# Patient Record
Sex: Female | Born: 1980
Health system: Southern US, Community
[De-identification: ages and names within clinical notes are randomized; demographics above are authoritative.]

## PROBLEM LIST (undated history)

## (undated) ENCOUNTER — Inpatient Hospital Stay (HOSPITAL_COMMUNITY): Payer: Self-pay

## (undated) DIAGNOSIS — O139 Gestational [pregnancy-induced] hypertension without significant proteinuria, unspecified trimester: Secondary | ICD-10-CM

## (undated) DIAGNOSIS — F32A Depression, unspecified: Secondary | ICD-10-CM

## (undated) DIAGNOSIS — D649 Anemia, unspecified: Secondary | ICD-10-CM

## (undated) DIAGNOSIS — F329 Major depressive disorder, single episode, unspecified: Secondary | ICD-10-CM

## (undated) HISTORY — PX: DILATION AND CURETTAGE OF UTERUS: SHX78

---

## 2009-12-10 ENCOUNTER — Emergency Department (HOSPITAL_BASED_OUTPATIENT_CLINIC_OR_DEPARTMENT_OTHER): Admission: EM | Admit: 2009-12-10 | Discharge: 2009-12-10 | Payer: Self-pay | Admitting: Emergency Medicine

## 2011-01-21 ENCOUNTER — Emergency Department (HOSPITAL_BASED_OUTPATIENT_CLINIC_OR_DEPARTMENT_OTHER)
Admission: EM | Admit: 2011-01-21 | Discharge: 2011-01-21 | Disposition: A | Discharge status: Home / Self Care | Payer: Self-pay | Attending: Emergency Medicine | Admitting: Emergency Medicine

## 2011-01-21 ENCOUNTER — Other Ambulatory Visit: Payer: Self-pay | Admitting: Emergency Medicine

## 2011-01-21 DIAGNOSIS — IMO0002 Reserved for concepts with insufficient information to code with codable children: Secondary | ICD-10-CM | POA: Insufficient documentation

## 2011-01-24 LAB — PREGNANCY, URINE: Preg Test, Ur: NEGATIVE

## 2011-02-08 ENCOUNTER — Encounter: Payer: Self-pay | Admitting: Obstetrics and Gynecology

## 2011-09-04 ENCOUNTER — Encounter (HOSPITAL_COMMUNITY): Payer: Self-pay

## 2011-09-04 ENCOUNTER — Inpatient Hospital Stay (HOSPITAL_COMMUNITY): Payer: Medicaid Other

## 2011-09-04 ENCOUNTER — Inpatient Hospital Stay (HOSPITAL_COMMUNITY)
Admission: AD | Admit: 2011-09-04 | Discharge: 2011-09-04 | Disposition: A | Discharge status: Home / Self Care | Payer: Medicaid Other | Source: Ambulatory Visit | Attending: Family Medicine | Admitting: Family Medicine

## 2011-09-04 DIAGNOSIS — R109 Unspecified abdominal pain: Secondary | ICD-10-CM | POA: Insufficient documentation

## 2011-09-04 DIAGNOSIS — O209 Hemorrhage in early pregnancy, unspecified: Secondary | ICD-10-CM

## 2011-09-04 HISTORY — DX: Anemia, unspecified: D64.9

## 2011-09-04 HISTORY — DX: Gestational (pregnancy-induced) hypertension without significant proteinuria, unspecified trimester: O13.9

## 2011-09-04 LAB — URINALYSIS, ROUTINE W REFLEX MICROSCOPIC
Ketones, ur: NEGATIVE mg/dL
Leukocytes, UA: NEGATIVE
Nitrite: NEGATIVE
Protein, ur: NEGATIVE mg/dL
Urobilinogen, UA: 0.2 mg/dL (ref 0.0–1.0)

## 2011-09-04 LAB — CBC
HCT: 32.7 % — ABNORMAL LOW (ref 36.0–46.0)
Hemoglobin: 11 g/dL — ABNORMAL LOW (ref 12.0–15.0)
MCV: 87 fL (ref 78.0–100.0)
RDW: 12.3 % (ref 11.5–15.5)
WBC: 5.8 10*3/uL (ref 4.0–10.5)

## 2011-09-04 LAB — WET PREP, GENITAL
Clue Cells Wet Prep HPF POC: NONE SEEN
Trich, Wet Prep: NONE SEEN

## 2011-09-04 NOTE — ED Provider Notes (Signed)
Chart reviewed and agree with management and plan.  

## 2011-09-04 NOTE — Progress Notes (Signed)
Patient state the first day of her last normal period was 8-10. Took Plan B on 8-10. Started bleeding again 8-26. Started cramping about one week ago until last night. No pain at this time. Had spotting this am. Last night did 2 home pregnancy tests, one was positive and one negative.

## 2011-09-04 NOTE — Progress Notes (Signed)
Pt states LMP-06/01/2011, took Plan B 06/09/2011, no menses, then had 4-5 day cycle on 06/17/2011. No menses since then. Had cramping last week that felt like menstrual cycle, no pain presently. Had both pos/neg results on home upts. ?yeast infection, white vaginal d/c, odorous, thick, noted x3-4 months.

## 2011-09-04 NOTE — ED Provider Notes (Signed)
History   Pt presents today c/o lower abd pain and spotting on and off since the end of August. Her last episode of intercourse was several days ago. She denies fever, vag irritation, or any other sx at this time.  Chief Complaint  Patient presents with  . Possible Pregnancy   HPI  OB History    Grav Para Term Preterm Abortions TAB SAB Ect Mult Living   5 1 1  3 3    1       Past Medical History  Diagnosis Date  . Pregnancy induced hypertension   . Anemia     Past Surgical History  Procedure Date  . Cesarean section   . Dilation and curettage of uterus     No family history on file.  History  Substance Use Topics  . Smoking status: Current Everyday Smoker -- 1.0 packs/day    Types: Cigarettes  . Smokeless tobacco: Never Used  . Alcohol Use: No    Allergies: No Known Allergies  Prescriptions prior to admission  Medication Sig Dispense Refill  . Aspirin-Salicylamide-Caffeine (BC HEADACHE POWDER PO) Take 1 packet by mouth daily as needed. For headache         Review of Systems  Constitutional: Negative for fever.  Cardiovascular: Negative for chest pain.  Gastrointestinal: Positive for abdominal pain. Negative for nausea, vomiting, diarrhea and constipation.  Genitourinary: Negative for dysuria, urgency, frequency and hematuria.  Neurological: Negative for dizziness and headaches.  Psychiatric/Behavioral: Negative for depression and suicidal ideas.   Physical Exam   Blood pressure 118/53, pulse 80, temperature 98.6 F (37 C), temperature source Oral, resp. rate 16, height 5' 4.5" (1.638 m), weight 199 lb 3.2 oz (90.357 kg), last menstrual period 06/01/2011, SpO2 99.00%.  Physical Exam  Nursing note and vitals reviewed. Constitutional: She is oriented to person, place, and time. She appears well-developed and well-nourished. No distress.  HENT:  Head: Normocephalic and atraumatic.  Eyes: EOM are normal. Pupils are equal, round, and reactive to light.  GI:  Soft. She exhibits no distension. There is no tenderness. There is no rebound and no guarding.  Genitourinary: No bleeding around the vagina. Vaginal discharge found.       Cervix Lg/closed. No adnexal masses.  Neurological: She is alert and oriented to person, place, and time.  Skin: Skin is warm and dry. She is not diaphoretic.  Psychiatric: She has a normal mood and affect. Her behavior is normal. Judgment and thought content normal.    MAU Course  Procedures  Wet prep and GC/Chlamydia cultures done.  Results for orders placed during the hospital encounter of 09/04/11 (from the past 24 hour(s))  URINALYSIS, ROUTINE W REFLEX MICROSCOPIC     Status: Abnormal   Collection Time   09/04/11 10:51 AM      Component Value Range   Color, Urine YELLOW  YELLOW    Appearance CLOUDY (*) CLEAR    Specific Gravity, Urine 1.025  1.005 - 1.030    pH 8.0  5.0 - 8.0    Glucose, UA NEGATIVE  NEGATIVE (mg/dL)   Hgb urine dipstick NEGATIVE  NEGATIVE    Bilirubin Urine NEGATIVE  NEGATIVE    Ketones, ur NEGATIVE  NEGATIVE (mg/dL)   Protein, ur NEGATIVE  NEGATIVE (mg/dL)   Urobilinogen, UA 0.2  0.0 - 1.0 (mg/dL)   Nitrite NEGATIVE  NEGATIVE    Leukocytes, UA NEGATIVE  NEGATIVE   POCT PREGNANCY, URINE     Status: Normal   Collection Time  09/04/11 10:55 AM      Component Value Range   Preg Test, Ur POSITIVE    WET PREP, GENITAL     Status: Normal   Collection Time   09/04/11 11:24 AM      Component Value Range   Yeast, Wet Prep NONE SEEN  NONE SEEN    Trich, Wet Prep NONE SEEN  NONE SEEN    Clue Cells, Wet Prep NONE SEEN  NONE SEEN    WBC, Wet Prep HPF POC NONE SEEN  NONE SEEN   ABO/RH     Status: Normal   Collection Time   09/04/11 11:35 AM      Component Value Range   ABO/RH(D) O POS    HCG, QUANTITATIVE, PREGNANCY     Status: Abnormal   Collection Time   09/04/11 11:35 AM      Component Value Range   hCG, Beta Chain, Quant, S 42503 (*) <5 (mIU/mL)  CBC     Status: Abnormal    Collection Time   09/04/11 11:35 AM      Component Value Range   WBC 5.8  4.0 - 10.5 (K/uL)   RBC 3.76 (*) 3.87 - 5.11 (MIL/uL)   Hemoglobin 11.0 (*) 12.0 - 15.0 (g/dL)   HCT 16.1 (*) 09.6 - 46.0 (%)   MCV 87.0  78.0 - 100.0 (fL)   MCH 29.3  26.0 - 34.0 (pg)   MCHC 33.6  30.0 - 36.0 (g/dL)   RDW 04.5  40.9 - 81.1 (%)   Platelets 231  150 - 400 (K/uL)   US shows single IUP at 6.5wks with an EDC of 04/24/12.  Assessment and Plan  Bleeding in preg: discussed with pt at length. She will f/u with her OB provider. Discussed diet, activity, risks, and precautions.  Clinton Gallant. Tashay Bozich III, DrHSc, MPAS, PA-C  09/04/2011, 11:27 AM   Henrietta Hoover, PA 09/04/11 1244

## 2011-09-05 LAB — GC/CHLAMYDIA PROBE AMP, GENITAL: Chlamydia, DNA Probe: NEGATIVE

## 2011-09-27 ENCOUNTER — Emergency Department (HOSPITAL_BASED_OUTPATIENT_CLINIC_OR_DEPARTMENT_OTHER)
Admission: EM | Admit: 2011-09-27 | Discharge: 2011-09-27 | Disposition: A | Discharge status: Home / Self Care | Payer: Medicaid Other | Attending: Emergency Medicine | Admitting: Emergency Medicine

## 2011-09-27 ENCOUNTER — Encounter (HOSPITAL_BASED_OUTPATIENT_CLINIC_OR_DEPARTMENT_OTHER): Payer: Self-pay | Admitting: Emergency Medicine

## 2011-09-27 DIAGNOSIS — N760 Acute vaginitis: Secondary | ICD-10-CM | POA: Insufficient documentation

## 2011-09-27 DIAGNOSIS — A499 Bacterial infection, unspecified: Secondary | ICD-10-CM | POA: Insufficient documentation

## 2011-09-27 DIAGNOSIS — B9689 Other specified bacterial agents as the cause of diseases classified elsewhere: Secondary | ICD-10-CM | POA: Insufficient documentation

## 2011-09-27 DIAGNOSIS — F17200 Nicotine dependence, unspecified, uncomplicated: Secondary | ICD-10-CM | POA: Insufficient documentation

## 2011-09-27 LAB — WET PREP, GENITAL
Trich, Wet Prep: NONE SEEN
Yeast Wet Prep HPF POC: NONE SEEN

## 2011-09-27 LAB — URINALYSIS, ROUTINE W REFLEX MICROSCOPIC
Glucose, UA: NEGATIVE mg/dL
Hgb urine dipstick: NEGATIVE
Specific Gravity, Urine: 1.025 (ref 1.005–1.030)
pH: 7.5 (ref 5.0–8.0)

## 2011-09-27 LAB — URINE MICROSCOPIC-ADD ON

## 2011-09-27 MED ORDER — METRONIDAZOLE 500 MG PO TABS: 500.0000 mg | ORAL_TABLET | Freq: Two times a day (BID) | ORAL | Status: AC

## 2011-09-27 MED ORDER — METRONIDAZOLE 500 MG PO TABS
500.0000 mg | ORAL_TABLET | Freq: Once | ORAL | Status: AC
  Administered 2011-09-27: 500 mg via ORAL
  Filled 2011-09-27: qty 1

## 2011-09-27 NOTE — ED Notes (Signed)
Patient states she is [redacted] weeks pregnant.  States she is having vaginal bloody spotting for the last 2 weeks.  States she is also having vaginal discharge, has been checked and is told she does not have a vaginal infection.  States she is having pelvic pressure as well.  Denies any pain.

## 2011-09-27 NOTE — ED Notes (Signed)
Gave pt medication and discharge instructions per request from primary nurse.

## 2011-09-27 NOTE — ED Provider Notes (Signed)
History     CSN: [id number] Arrival date & time: 09/27/2011  8:16 AM   First MD Initiated Contact with Patient 09/27/11 [phone]      Chief Complaint  Patient presents with  . Vaginal Bleeding    (Consider location/radiation/quality/duration/timing/severity/associated sxs/prior treatment) HPI The patient is a 30 year old G5 P1 031 at [redacted] weeks gestation with last menstrual period on August 10 of 2012 who presents today complaining of vaginal spotting. Patient has never had a miscarriage previously. She describes having some "pelvic pressure". She is concerned that she could be having a miscarriage. She was diagnosed with pregnancy at [facility]. At that time the patient had an ultrasound with estimated gestational age of [redacted] weeks and 5 days. She was told that everything was fine at that time. She has not been able to followup with an OB/GYN as of yet as her Medicaid is still pending. The patient denies any nausea or vomiting. She denies any fevers. She says she feels like she constantly has gas and some mild constipation. She denies any diarrhea. Patient always feels like she has vaginal discharge that has been checked and told that she does not have a bacterial infection. She denies any urinary symptoms. There are no other associated or modifying factors. Past Medical History  Diagnosis Date  . Pregnancy induced hypertension   . Anemia     Past Surgical History  Procedure Date  . Cesarean section   . Dilation and curettage of uterus     History reviewed. No pertinent family history.  History  Substance Use Topics  . Smoking status: Current Everyday Smoker -- 1.0 packs/day    Types: Cigarettes  . Smokeless tobacco: Never Used  . Alcohol Use: No    OB History    Grav Para Term Preterm Abortions TAB SAB Ect Mult Living   5 1 1  3 3    1       Review of Systems  Constitutional: Negative.   HENT: Negative.   Eyes: Negative.   Cardiovascular: Negative.   Gastrointestinal:  Positive for abdominal pain.  Genitourinary: Positive for vaginal bleeding.  Musculoskeletal: Negative.   Neurological: Negative.   Hematological: Negative.   Psychiatric/Behavioral: Negative.   All other systems reviewed and are negative.    Allergies  Review of patient's allergies indicates no known allergies.  Home Medications   Current Outpatient Rx  Name Route Sig Dispense Refill  . BC HEADACHE POWDER PO Oral Take 1 packet by mouth daily as needed. For headache       BP 134/80  Pulse 73  Temp(Src) 97.9 F (36.6 C) (Oral)  Resp 18  Ht 5\' 4"  (1.626 m)  Wt 200 lb (90.719 kg)  BMI 34.33 kg/m2  SpO2 100%  LMP 06/01/2011  Physical Exam  Nursing note and vitals reviewed. Constitutional: She is oriented to person, place, and time. She appears well-developed and well-nourished. No distress.  HENT:  Head: Normocephalic and atraumatic.  Eyes: Conjunctivae and EOM are normal. Pupils are equal, round, and reactive to light.  Neck: Normal range of motion.  Cardiovascular: Normal rate, regular rhythm, normal heart sounds and intact distal pulses.  Exam reveals no gallop and no friction rub.   No murmur heard. Pulmonary/Chest: Effort normal and breath sounds normal. No respiratory distress. She has no wheezes. She has no rales.  Abdominal: Soft. Bowel sounds are normal. She exhibits no distension. There is no tenderness. There is no rebound and no guarding.  Genitourinary: Cervix exhibits discharge. Cervix  exhibits no motion tenderness and no friability. Right adnexum displays no mass and no tenderness. Left adnexum displays no mass and no tenderness. Vaginal discharge found.       Patient with slight yellowish discharge. There is a very small amount of blood appreciated in this. Cervix is closed.  Musculoskeletal: Normal range of motion. She exhibits no edema.  Neurological: She is alert and oriented to person, place, and time. No cranial nerve deficit. She exhibits normal muscle  tone. Coordination normal.  Skin: Skin is warm and dry. No rash noted.  Psychiatric: She has a normal mood and affect.    ED Course  Procedures (including critical care time)  Labs Reviewed  URINALYSIS, ROUTINE W REFLEX MICROSCOPIC - Abnormal; Notable for the following:    APPearance TURBID (*)    Ketones, ur 15 (*)    All other components within normal limits  WET PREP, GENITAL - Abnormal; Notable for the following:    Clue Cells, Wet Prep FEW (*)    WBC, Wet Prep HPF POC FEW (*)    All other components within normal limits  URINE MICROSCOPIC-ADD ON - Abnormal; Notable for the following:    Bacteria, UA FEW (*)    All other components within normal limits  PREGNANCY, URINE  GC/CHLAMYDIA PROBE AMP, GENITAL   No results found.   No diagnosis found.    MDM  Patient was evaluated and was very benign in appearance. Based on her concerns pelvic exam was performed. Urinalysis, urine pregnancy, and pelvic exam samples were obtained.  Patient's urine was bland. Pelvic exam did yield a wet prep which showed clue cells. Given the patient did complain of vaginal discharge as well as pelvic pressure Flagyl was prescribed. She was also given her first dose here. She will take this twice a day for 7 days. Patient was also told not to take aspirin during pregnancy. She was advised to take Tylenol instead for normal complaints. Patient was discharged in good condition.        Cyndra Numbers, MD 09/27/11 1027

## 2011-09-28 LAB — GC/CHLAMYDIA PROBE AMP, GENITAL: GC Probe Amp, Genital: NEGATIVE

## 2011-11-14 ENCOUNTER — Other Ambulatory Visit: Payer: Self-pay | Admitting: Obstetrics & Gynecology

## 2011-11-15 ENCOUNTER — Other Ambulatory Visit: Payer: Self-pay | Admitting: Obstetrics and Gynecology

## 2011-11-22 ENCOUNTER — Other Ambulatory Visit: Payer: Self-pay | Admitting: Obstetrics and Gynecology

## 2011-11-22 ENCOUNTER — Encounter: Payer: Self-pay | Admitting: Obstetrics and Gynecology

## 2011-11-22 LAB — US OB COMP LESS 14 WKS

## 2011-12-18 ENCOUNTER — Other Ambulatory Visit: Payer: Self-pay | Admitting: Obstetrics and Gynecology

## 2011-12-18 DIAGNOSIS — R772 Abnormality of alphafetoprotein: Secondary | ICD-10-CM

## 2011-12-18 DIAGNOSIS — Z3689 Encounter for other specified antenatal screening: Secondary | ICD-10-CM

## 2011-12-21 ENCOUNTER — Ambulatory Visit (HOSPITAL_COMMUNITY)
Admission: RE | Admit: 2011-12-21 | Discharge: 2011-12-21 | Disposition: A | Discharge status: Home / Self Care | Payer: Medicaid Other | Source: Ambulatory Visit | Attending: Obstetrics and Gynecology | Admitting: Obstetrics and Gynecology

## 2011-12-21 ENCOUNTER — Encounter (HOSPITAL_COMMUNITY): Payer: Self-pay

## 2011-12-21 ENCOUNTER — Other Ambulatory Visit: Payer: Self-pay

## 2011-12-21 DIAGNOSIS — Z363 Encounter for antenatal screening for malformations: Secondary | ICD-10-CM | POA: Insufficient documentation

## 2011-12-21 DIAGNOSIS — O34219 Maternal care for unspecified type scar from previous cesarean delivery: Secondary | ICD-10-CM | POA: Insufficient documentation

## 2011-12-21 DIAGNOSIS — Z3689 Encounter for other specified antenatal screening: Secondary | ICD-10-CM

## 2011-12-21 DIAGNOSIS — Z1389 Encounter for screening for other disorder: Secondary | ICD-10-CM | POA: Insufficient documentation

## 2011-12-21 DIAGNOSIS — O358XX0 Maternal care for other (suspected) fetal abnormality and damage, not applicable or unspecified: Secondary | ICD-10-CM | POA: Insufficient documentation

## 2011-12-21 DIAGNOSIS — R772 Abnormality of alphafetoprotein: Secondary | ICD-10-CM

## 2011-12-24 NOTE — Progress Notes (Signed)
Genetic Counseling  High-Risk Gestation Note  Appointment Date:  12/21/2011 Referred By: Fortino Sic, MD Date of Birth:  06/17/1981    Pregnancy History: [id number] Estimated Date of Delivery: 04/25/12 Estimated Gestational Age: [redacted]w[redacted]d Attending: Particia Nearing, MD  [name] was seen for genetic counseling because of an increased risk for fetal Down syndrome based on Quad screen performed through [employer].  She was counseled regarding the Quad screen result and the associated 1 in 169 risk for fetal Down syndrome. (However, we discussed that the Quad screen was first performed using the correct estimated date of delivery, which was also used for this third and final calculation, but a Down syndrome risk of 1 in 95 was given for the first result. After discussed with [facility] laboratory, it is unclear why these two risk assessments differ, given that both were calculated with the same estimated date of delivery.)  We reviewed chromosomes, nondisjunction, and the common features and variable prognosis of Down syndrome.  In addition, we reviewed the screen adjusted reduction in risks for trisomy 18 (1 in 4750) and ONTDs (less than 1 in 54,600).  We also discussed other explanations for a screen positive result including: a gestational dating error, differences in maternal metabolism, and normal variation.  We reviewed other available screening and diagnostic options including detailed ultrasound and amniocentesis.  We discussed the risks, limitations, and benefits of each.  We discussed another type of screening test, noninvasive prenatal testing (NIPT), which utilizes cell free fetal DNA found in the maternal circulation. This test is not diagnostic for chromosome conditions, but can provide information regarding the presence or absence of extra fetal DNA for chromosomes 13, 18 and 21. Thus, it would not identify or rule out all fetal aneuploidy. The reported detection rate is greater  than 99% for Trisomy 21, greater than 97% for Trisomy 18, and is approximately 80% (8 out of 10) for Trisomy 13. The false positive rate is thought to be less than 1% for any of these conditions. After thoughtful consideration of these options, [name] elected to have ultrasound and cell free fetal DNA ([facility]), but declined amniocentesis.  The cell free fetal DNA testing results will be back in approximately 8-10 days. We will contact the patient and forward results to her OB office once received. She understands that ultrasound and screening cannot rule out all birth defects or genetic syndromes.  The patient was advised of this limitation and states she still does not want diagnostic testing at this time.   However, she was counseled that 50-80% of fetuses with Down syndrome, when well visualized, have detectable anomalies or soft markers by ultrasound.  A complete ultrasound was performed today.  The ultrasound report will be sent under separate cover.  [name] was provided with written information regarding sickle cell anemia (SCA) including the carrier frequency and incidence in the African-American population, the availability of carrier testing and prenatal diagnosis if indicated.  In addition, we discussed that hemoglobinopathies are routinely screened for as part of the  newborn screening panel.  We discussed that her OB medical records indicated that screening was previously performed, and [name] was reported to have a Hemoglobin AA and thus not have sickle cell trait.    Both family histories were reviewed and found to be contributory for the patient's maternal half-nephew (her maternal half-brother's son) who died at age 26 year due to "heart and lung issues" from birth. Very limited information was known regarding  his specific features and an underlying cause. We discussed that heart and lung problems at birth can be sporadic, genetic, environmental, or multifactorial.  Without further information regarding the provided family history, an accurate genetic risk cannot be calculated. Targeted ultrasound is available to assess fetal growth and development in detail. However, without additional information regarding the specific health concerns in this relative, we are unable to assess whether or not these features may be visualized prenatally. Further genetic counseling is warranted if more information is obtained.  Ms. Jerrye Noble denied exposure to environmental toxins or chemical agents. She denied the use of tobacco or street drugs. She reported drinking alcohol prior to being aware of the pregnancy. The all-or-none period was discussed, meaning exposures that occur in the first 4 weeks of gestation are typically thought to either not affect the pregnancy at all or result in a miscarriage.  Prenatal alcohol exposure after the first 4 weeks of gestation can increase the risk for growth delays, small head size, heart defects, eye and facial differences, as well as behavior problems and learning disabilities. The risk of these to occur tends to increase with the amount of alcohol consumed. However, because there is no identified safe amount of alcohol in pregnancy, it is recommended to completely avoid alcohol in pregnancy. Given the reported timing of exposure, risk for associated effects are likely low in the current pregnancy. She denied significant viral illnesses during the course of her pregnancy. Her medical and surgical histories were noncontributory.   I counseled [name] for approximately 35 minutes regarding the above risks and available options.     Quinn Plowman, MS,  Certified Genetic Counselor  12/24/2011

## 2012-01-03 ENCOUNTER — Other Ambulatory Visit (HOSPITAL_COMMUNITY): Payer: Self-pay | Admitting: Obstetrics and Gynecology

## 2012-01-03 ENCOUNTER — Telehealth (HOSPITAL_COMMUNITY): Payer: Self-pay | Admitting: MS"

## 2012-01-03 NOTE — Telephone Encounter (Signed)
Called Julia [name] to discuss her [facility], cell free fetal DNA testing.  We reviewed that these are within normal limits, showing a less than 1 in 10,000 risk for trisomies 21, 18 and 13.  We reviewed that this testing identifies > 99% of pregnancies with trisomy 21, >97% of pregnancies with trisomy 43, and >80% with trisomy 55; the false positive rate is <0.1% for all conditions.  She understands that this testing does not identify all genetic conditions.  All questions were answered to her satisfaction, she was encouraged to call with additional questions or concerns.  Quinn Plowman, MS Patent attorney

## 2012-01-03 NOTE — H&P (Signed)
Julia [name] is an 31 y.o. female.  Z6X0 A4 for a repeat C/S.  Prenatal course has been uncomplicated. The patient did present late for prenatal care.  Pertinent Gynecological History: Menses: na Bleeding: no Contraception: none DES exposure: denies Blood transfusions: none Sexually transmitted diseases: no past history Previous GYN Procedures: Previously seen severe infection for failure to dilate  Last mammogram: na Date: na Last pap: unknown Date: June 2012   Menstrual History: Menarche age:36 Patient's last menstrual period was 06/01/2011.    Past Medical History  Diagnosis Date  . Pregnancy induced hypertension   . Anemia     Past Surgical History  Procedure Date  . Cesarean section   . Dilation and curettage of uterus     No family history on file.  Social History:  reports that she has been smoking Cigarettes.  She has been smoking about 1 pack per day. She has never used smokeless tobacco. She reports that she does not drink alcohol or use illicit drugs.  Allergies: No Known Allergies   (Not in a hospital admission)  Review of Systems  Constitutional: Negative.   HENT: Negative.   Eyes: Negative.   Respiratory: Negative.   Gastrointestinal: Negative.   Genitourinary: Negative.   Musculoskeletal: Negative.   Skin: Positive for rash.  Neurological: Negative.   Endo/Heme/Allergies: Negative.   Psychiatric/Behavioral: Negative.   All other systems reviewed and are negative.    Last menstrual period 06/01/2011. Physical Exam  Constitutional: She is oriented to person, place, and time. She appears well-developed and well-nourished.  HENT:  Head: Normocephalic and atraumatic.  Mouth/Throat: No oropharyngeal exudate.  Eyes: Right eye exhibits no discharge. Left eye exhibits no discharge. No scleral icterus.  Neck: Normal range of motion. No JVD present. No tracheal deviation present. No thyromegaly present.  Cardiovascular: Normal rate, regular rhythm,  normal heart sounds and intact distal pulses.  Exam reveals no gallop and no friction rub.   No murmur heard. Respiratory: Effort normal. No stridor. No respiratory distress. She has no wheezes. She has no rales. She exhibits no tenderness.  GI: Soft. Bowel sounds are normal. She exhibits no distension and no mass. There is no tenderness. There is no rebound and no guarding.  Genitourinary: Vagina normal. No vaginal discharge found.  Musculoskeletal: Normal range of motion. She exhibits no edema and no tenderness.  Lymphadenopathy:    She has no cervical adenopathy.  Neurological: She is alert and oriented to person, place, and time. She has normal reflexes. She displays normal reflexes. She exhibits normal muscle tone. Coordination normal.  Skin: Skin is warm. No rash noted. No erythema. No pallor.  Psychiatric: She has a normal mood and affect. Her behavior is normal. Judgment and thought content normal.    No results found for this or any previous visit (from the past 24 hour(s)).  No results found.  Assessment/Plan:  39 week pregnancy prior cesarean section for repeat C-section    Daira Hine E 01/03/2012, 2:25 PM

## 2012-02-08 LAB — OB RESULTS CONSOLE HEPATITIS B SURFACE ANTIGEN: Hepatitis B Surface Ag: NEGATIVE

## 2012-02-08 LAB — OB RESULTS CONSOLE ABO/RH: RH Type: POSITIVE

## 2012-02-08 LAB — OB RESULTS CONSOLE RUBELLA ANTIBODY, IGM: Rubella: IMMUNE

## 2012-02-08 LAB — OB RESULTS CONSOLE ANTIBODY SCREEN: Antibody Screen: NEGATIVE

## 2012-04-03 ENCOUNTER — Encounter (HOSPITAL_COMMUNITY): Payer: Self-pay | Admitting: Pharmacist

## 2012-04-07 ENCOUNTER — Encounter (HOSPITAL_COMMUNITY): Payer: Self-pay

## 2012-04-08 ENCOUNTER — Inpatient Hospital Stay (HOSPITAL_COMMUNITY): Admission: RE | Admit: 2012-04-08 | Payer: Medicaid Other | Source: Ambulatory Visit

## 2012-04-11 ENCOUNTER — Encounter (HOSPITAL_COMMUNITY): Payer: Self-pay

## 2012-04-11 ENCOUNTER — Encounter (HOSPITAL_COMMUNITY)
Admission: RE | Admit: 2012-04-11 | Discharge: 2012-04-11 | Disposition: A | Discharge status: Home / Self Care | Payer: Medicaid Other | Source: Ambulatory Visit | Attending: Obstetrics and Gynecology | Admitting: Obstetrics and Gynecology

## 2012-04-11 HISTORY — DX: Depression, unspecified: F32.A

## 2012-04-11 HISTORY — DX: Major depressive disorder, single episode, unspecified: F32.9

## 2012-04-11 LAB — CBC
HCT: 29.5 % — ABNORMAL LOW (ref 36.0–46.0)
MCH: 28.9 pg (ref 26.0–34.0)
MCHC: 33.6 g/dL (ref 30.0–36.0)
MCV: 86 fL (ref 78.0–100.0)
RDW: 14.1 % (ref 11.5–15.5)
WBC: 8.7 10*3/uL (ref 4.0–10.5)

## 2012-04-11 LAB — SURGICAL PCR SCREEN: Staphylococcus aureus: NEGATIVE

## 2012-04-11 NOTE — Patient Instructions (Addendum)
YOUR PROCEDURE IS SCHEDULED ON:04/17/12  ENTER THROUGH THE MAIN ENTRANCE OF [facility] AT: 6am  USE DESK PHONE AND DIAL [phone] TO INFORM us OF YOUR ARRIVAL  CALL [phone] IF YOU HAVE ANY QUESTIONS OR PROBLEMS PRIOR TO YOUR ARRIVAL.  REMEMBER: DO NOT EAT OR DRINK AFTER MIDNIGHT :Wed.    YOU MAY BRUSH YOUR TEETH THE MORNING OF SURGERY   TAKE THESE MEDICINES THE DAY OF SURGERY WITH SIP OF WATER:none   DO NOT WEAR JEWELRY, EYE MAKEUP, LIPSTICK OR DARK FINGERNAIL POLISH DO NOT WEAR LOTIONS  DO NOT SHAVE FOR 48 HOURS PRIOR TO SURGERY  YOU WILL NOT BE ALLOWED TO DRIVE YOURSELF HOME

## 2012-04-14 ENCOUNTER — Encounter (HOSPITAL_COMMUNITY): Payer: Self-pay

## 2012-04-21 ENCOUNTER — Other Ambulatory Visit: Payer: Self-pay | Admitting: Obstetrics & Gynecology
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
## 2012-04-22 MED ORDER — CEFAZOLIN SODIUM-DEXTROSE 2-3 GM-% IV SOLR
2.0000 g | INTRAVENOUS | Status: AC
  Administered 2012-04-23: 2 g via INTRAVENOUS

## 2012-04-23 ENCOUNTER — Encounter (HOSPITAL_COMMUNITY): Payer: Self-pay

## 2012-04-23 ENCOUNTER — Inpatient Hospital Stay (HOSPITAL_COMMUNITY): Payer: Medicaid Other

## 2012-04-23 ENCOUNTER — Encounter (HOSPITAL_COMMUNITY): Payer: Self-pay | Admitting: Anesthesiology

## 2012-04-23 ENCOUNTER — Encounter (HOSPITAL_COMMUNITY): Payer: Self-pay | Admitting: *Deleted

## 2012-04-23 ENCOUNTER — Encounter (HOSPITAL_COMMUNITY)
Admission: RE | Disposition: A | Discharge status: Home / Self Care | Payer: Self-pay | Source: Ambulatory Visit | Attending: Obstetrics & Gynecology

## 2012-04-23 ENCOUNTER — Inpatient Hospital Stay (HOSPITAL_COMMUNITY)
Admission: RE | Admit: 2012-04-23 | Discharge: 2012-04-26 | DRG: 766 | Disposition: A | Discharge status: Home / Self Care | Payer: Medicaid Other | Source: Ambulatory Visit | Attending: Obstetrics & Gynecology | Admitting: Obstetrics & Gynecology

## 2012-04-23 DIAGNOSIS — Z98891 History of uterine scar from previous surgery: Secondary | ICD-10-CM

## 2012-04-23 DIAGNOSIS — N838 Other noninflammatory disorders of ovary, fallopian tube and broad ligament: Secondary | ICD-10-CM | POA: Diagnosis present

## 2012-04-23 DIAGNOSIS — O34599 Maternal care for other abnormalities of gravid uterus, unspecified trimester: Secondary | ICD-10-CM | POA: Diagnosis present

## 2012-04-23 DIAGNOSIS — O34219 Maternal care for unspecified type scar from previous cesarean delivery: Principal | ICD-10-CM | POA: Diagnosis present

## 2012-04-23 LAB — TYPE AND SCREEN: Antibody Screen: NEGATIVE

## 2012-04-23 SURGERY — Surgical Case
Anesthesia: Spinal | Site: Abdomen | Wound class: Clean Contaminated

## 2012-04-23 MED ORDER — MORPHINE SULFATE 0.5 MG/ML IJ SOLN
INTRAMUSCULAR | Status: AC
  Filled 2012-04-23: qty 10

## 2012-04-23 MED ORDER — OXYTOCIN 10 UNIT/ML IJ SOLN
INTRAMUSCULAR | Status: AC
  Filled 2012-04-23: qty 4

## 2012-04-23 MED ORDER — PHENYLEPHRINE 40 MCG/ML (10ML) SYRINGE FOR IV PUSH (FOR BLOOD PRESSURE SUPPORT)
PREFILLED_SYRINGE | INTRAVENOUS | Status: AC
  Filled 2012-04-23: qty 5

## 2012-04-23 MED ORDER — MENTHOL 3 MG MT LOZG: 1.0000 | LOZENGE | OROMUCOSAL | Status: DC | PRN

## 2012-04-23 MED ORDER — ONDANSETRON HCL 4 MG/2ML IJ SOLN: 4.0000 mg | Freq: Three times a day (TID) | INTRAMUSCULAR | Status: DC | PRN

## 2012-04-23 MED ORDER — PHENYLEPHRINE HCL 10 MG/ML IJ SOLN
INTRAMUSCULAR | Status: DC | PRN
  Administered 2012-04-23 (×2): 40 ug via INTRAVENOUS
  Administered 2012-04-23 (×6): 80 ug via INTRAVENOUS

## 2012-04-23 MED ORDER — DIPHENHYDRAMINE HCL 25 MG PO CAPS: 25.0000 mg | ORAL_CAPSULE | Freq: Four times a day (QID) | ORAL | Status: DC | PRN

## 2012-04-23 MED ORDER — LACTATED RINGERS IV SOLN
INTRAVENOUS | Status: DC
  Administered 2012-04-23 (×4): via INTRAVENOUS

## 2012-04-23 MED ORDER — KETOROLAC TROMETHAMINE 30 MG/ML IJ SOLN
30.0000 mg | Freq: Four times a day (QID) | INTRAMUSCULAR | Status: AC | PRN
  Administered 2012-04-23: 30 mg via INTRAVENOUS
  Filled 2012-04-23: qty 1

## 2012-04-23 MED ORDER — SENNOSIDES-DOCUSATE SODIUM 8.6-50 MG PO TABS
2.0000 | ORAL_TABLET | Freq: Every day | ORAL | Status: DC
  Administered 2012-04-24 – 2012-04-25 (×2): 2 via ORAL

## 2012-04-23 MED ORDER — HYDROMORPHONE HCL PF 1 MG/ML IJ SOLN
INTRAMUSCULAR | Status: AC
  Filled 2012-04-23: qty 1

## 2012-04-23 MED ORDER — ONDANSETRON HCL 4 MG/2ML IJ SOLN
4.0000 mg | INTRAMUSCULAR | Status: DC | PRN
  Administered 2012-04-23: 4 mg via INTRAVENOUS
  Filled 2012-04-23: qty 2

## 2012-04-23 MED ORDER — NALBUPHINE HCL 10 MG/ML IJ SOLN: 5.0000 mg | INTRAMUSCULAR | Status: DC | PRN

## 2012-04-23 MED ORDER — SCOPOLAMINE 1 MG/3DAYS TD PT72
MEDICATED_PATCH | TRANSDERMAL | Status: AC
  Administered 2012-04-23: 1.5 mg via TRANSDERMAL
  Filled 2012-04-23: qty 1

## 2012-04-23 MED ORDER — NALOXONE HCL 0.4 MG/ML IJ SOLN: 0.4000 mg | INTRAMUSCULAR | Status: DC | PRN

## 2012-04-23 MED ORDER — MEPERIDINE HCL 25 MG/ML IJ SOLN: 6.2500 mg | INTRAMUSCULAR | Status: DC | PRN

## 2012-04-23 MED ORDER — ONDANSETRON HCL 4 MG/2ML IJ SOLN
INTRAMUSCULAR | Status: AC
  Filled 2012-04-23: qty 2

## 2012-04-23 MED ORDER — KETOROLAC TROMETHAMINE 30 MG/ML IJ SOLN: 30.0000 mg | Freq: Four times a day (QID) | INTRAMUSCULAR | Status: AC | PRN

## 2012-04-23 MED ORDER — OXYTOCIN 10 UNIT/ML IJ SOLN
40.0000 [IU] | INTRAVENOUS | Status: DC | PRN
  Administered 2012-04-23: 40 [IU] via INTRAVENOUS

## 2012-04-23 MED ORDER — WITCH HAZEL-GLYCERIN EX PADS: 1.0000 "application " | MEDICATED_PAD | CUTANEOUS | Status: DC | PRN

## 2012-04-23 MED ORDER — SCOPOLAMINE 1 MG/3DAYS TD PT72: 1.0000 | MEDICATED_PATCH | Freq: Once | TRANSDERMAL | Status: DC

## 2012-04-23 MED ORDER — OXYTOCIN 40 UNITS IN LACTATED RINGERS INFUSION - SIMPLE MED: 62.5000 mL/h | INTRAVENOUS | Status: AC

## 2012-04-23 MED ORDER — ONDANSETRON HCL 4 MG/2ML IJ SOLN
INTRAMUSCULAR | Status: DC | PRN
  Administered 2012-04-23: 4 mg via INTRAVENOUS

## 2012-04-23 MED ORDER — LACTATED RINGERS IV SOLN
INTRAVENOUS | Status: DC
  Administered 2012-04-24: 02:00:00 via INTRAVENOUS

## 2012-04-23 MED ORDER — IBUPROFEN 600 MG PO TABS
600.0000 mg | ORAL_TABLET | Freq: Four times a day (QID) | ORAL | Status: DC
  Administered 2012-04-24 – 2012-04-26 (×10): 600 mg via ORAL
  Filled 2012-04-23 (×10): qty 1

## 2012-04-23 MED ORDER — MORPHINE SULFATE (PF) 0.5 MG/ML IJ SOLN
INTRAMUSCULAR | Status: DC | PRN
  Administered 2012-04-23: 200 ug via INTRATHECAL

## 2012-04-23 MED ORDER — SODIUM CHLORIDE 0.9 % IJ SOLN: 3.0000 mL | INTRAMUSCULAR | Status: DC | PRN

## 2012-04-23 MED ORDER — SCOPOLAMINE 1 MG/3DAYS TD PT72
1.0000 | MEDICATED_PATCH | TRANSDERMAL | Status: DC
  Administered 2012-04-23: 1.5 mg via TRANSDERMAL

## 2012-04-23 MED ORDER — OXYCODONE-ACETAMINOPHEN 5-325 MG PO TABS
1.0000 | ORAL_TABLET | ORAL | Status: DC | PRN
  Administered 2012-04-24: 1 via ORAL
  Administered 2012-04-24 – 2012-04-26 (×10): 2 via ORAL
  Filled 2012-04-23: qty 1
  Filled 2012-04-23 (×6): qty 2
  Filled 2012-04-23 (×2): qty 1
  Filled 2012-04-23 (×5): qty 2

## 2012-04-23 MED ORDER — ONDANSETRON HCL 4 MG PO TABS: 4.0000 mg | ORAL_TABLET | ORAL | Status: DC | PRN

## 2012-04-23 MED ORDER — FENTANYL CITRATE 0.05 MG/ML IJ SOLN
INTRAMUSCULAR | Status: AC
  Filled 2012-04-23: qty 2

## 2012-04-23 MED ORDER — DIPHENHYDRAMINE HCL 50 MG/ML IJ SOLN: 12.5000 mg | INTRAMUSCULAR | Status: DC | PRN

## 2012-04-23 MED ORDER — KETOROLAC TROMETHAMINE 60 MG/2ML IM SOLN
60.0000 mg | Freq: Once | INTRAMUSCULAR | Status: AC | PRN
  Administered 2012-04-23: 60 mg via INTRAMUSCULAR

## 2012-04-23 MED ORDER — LACTATED RINGERS IV SOLN
INTRAVENOUS | Status: DC | PRN
  Administered 2012-04-23: 14:00:00 via INTRAVENOUS

## 2012-04-23 MED ORDER — PROMETHAZINE HCL 25 MG/ML IJ SOLN: 6.2500 mg | INTRAMUSCULAR | Status: DC | PRN

## 2012-04-23 MED ORDER — METOCLOPRAMIDE HCL 5 MG/ML IJ SOLN: 10.0000 mg | Freq: Three times a day (TID) | INTRAMUSCULAR | Status: DC | PRN

## 2012-04-23 MED ORDER — NALOXONE HCL 0.4 MG/ML IJ SOLN: 1.0000 ug/kg/h | INTRAMUSCULAR | Status: DC | PRN

## 2012-04-23 MED ORDER — FENTANYL CITRATE 0.05 MG/ML IJ SOLN
INTRAMUSCULAR | Status: DC | PRN
  Administered 2012-04-23: 12.5 ug via INTRATHECAL

## 2012-04-23 MED ORDER — KETOROLAC TROMETHAMINE 60 MG/2ML IM SOLN
INTRAMUSCULAR | Status: AC
  Filled 2012-04-23: qty 2

## 2012-04-23 MED ORDER — ZOLPIDEM TARTRATE 5 MG PO TABS: 5.0000 mg | ORAL_TABLET | Freq: Every evening | ORAL | Status: DC | PRN

## 2012-04-23 MED ORDER — KETOROLAC TROMETHAMINE 30 MG/ML IJ SOLN: 15.0000 mg | Freq: Once | INTRAMUSCULAR | Status: DC | PRN

## 2012-04-23 MED ORDER — HYDROMORPHONE HCL PF 1 MG/ML IJ SOLN
0.2500 mg | INTRAMUSCULAR | Status: DC | PRN
  Administered 2012-04-23: 0.5 mg via INTRAVENOUS

## 2012-04-23 MED ORDER — SIMETHICONE 80 MG PO CHEW: 80.0000 mg | CHEWABLE_TABLET | ORAL | Status: DC | PRN

## 2012-04-23 MED ORDER — CEFAZOLIN SODIUM-DEXTROSE 2-3 GM-% IV SOLR
INTRAVENOUS | Status: AC
  Filled 2012-04-23: qty 50

## 2012-04-23 MED ORDER — TETANUS-DIPHTH-ACELL PERTUSSIS 5-2.5-18.5 LF-MCG/0.5 IM SUSP: 0.5000 mL | Freq: Once | INTRAMUSCULAR | Status: DC

## 2012-04-23 MED ORDER — DIBUCAINE 1 % RE OINT: 1.0000 "application " | TOPICAL_OINTMENT | RECTAL | Status: DC | PRN

## 2012-04-23 MED ORDER — PRENATAL MULTIVITAMIN CH
1.0000 | ORAL_TABLET | Freq: Every day | ORAL | Status: DC
  Administered 2012-04-24 – 2012-04-26 (×3): 1 via ORAL
  Filled 2012-04-23 (×3): qty 1

## 2012-04-23 MED ORDER — LANOLIN HYDROUS EX OINT: 1.0000 "application " | TOPICAL_OINTMENT | CUTANEOUS | Status: DC | PRN

## 2012-04-23 MED ORDER — DIPHENHYDRAMINE HCL 50 MG/ML IJ SOLN: 25.0000 mg | INTRAMUSCULAR | Status: DC | PRN

## 2012-04-23 MED ORDER — SIMETHICONE 80 MG PO CHEW
80.0000 mg | CHEWABLE_TABLET | Freq: Three times a day (TID) | ORAL | Status: DC
  Administered 2012-04-23 – 2012-04-26 (×8): 80 mg via ORAL

## 2012-04-23 MED ORDER — DIPHENHYDRAMINE HCL 25 MG PO CAPS
25.0000 mg | ORAL_CAPSULE | ORAL | Status: DC | PRN
  Filled 2012-04-23: qty 1

## 2012-04-23 MED ORDER — BUPIVACAINE IN DEXTROSE 0.75-8.25 % IT SOLN
INTRATHECAL | Status: DC | PRN
  Administered 2012-04-23: 1.6 mL via INTRATHECAL

## 2012-04-23 SURGICAL SUPPLY — 21 items
CLOTH BEACON ORANGE TIMEOUT ST (SAFETY) ×2 IMPLANT
ELECT REM PT RETURN 9FT ADLT (ELECTROSURGICAL) ×2
ELECTRODE REM PT RTRN 9FT ADLT (ELECTROSURGICAL) ×1 IMPLANT
GLOVE BIO SURGEON STRL SZ 6.5 (GLOVE) ×1 IMPLANT
GLOVE BIO SURGEON STRL SZ7.5 (GLOVE) ×2 IMPLANT
GLOVE BIOGEL PI IND STRL 7.0 (GLOVE) IMPLANT
GLOVE BIOGEL PI IND STRL 8 (GLOVE) ×1 IMPLANT
GLOVE BIOGEL PI INDICATOR 7.0 (GLOVE) ×1
GLOVE BIOGEL PI INDICATOR 8 (GLOVE) ×1
GLOVE INDICATOR 8.0 STRL GRN (GLOVE) ×1 IMPLANT
GOWN PREVENTION PLUS LG XLONG (DISPOSABLE) ×2 IMPLANT
GOWN PREVENTION PLUS XLARGE (GOWN DISPOSABLE) ×1 IMPLANT
GOWN STRL REIN XL XLG (GOWN DISPOSABLE) ×2 IMPLANT
NS IRRIG 1000ML POUR BTL (IV SOLUTION) ×3 IMPLANT
PACK C SECTION WH (CUSTOM PROCEDURE TRAY) ×2 IMPLANT
SUT MNCRL AB 3-0 PS2 27 (SUTURE) ×1 IMPLANT
SUT MNCRL AB 4-0 PS2 18 (SUTURE) ×2 IMPLANT
SUT PLAIN 2 0 XLH (SUTURE) ×2 IMPLANT
SUT VIC AB 0 CT1 36 (SUTURE) ×9 IMPLANT
TOWEL OR 17X24 6PK STRL BLUE (TOWEL DISPOSABLE) ×4 IMPLANT
TRAY FOLEY CATH 14FR (SET/KITS/TRAYS/PACK) ×1 IMPLANT

## 2012-04-23 NOTE — Anesthesia Procedure Notes (Signed)
Spinal  Patient location during procedure: OR Start time: 04/23/2012 1:52 PM End time: 04/23/2012 1:55 PM Staffing Anesthesiologist: Sandrea Hughs Performed by: anesthesiologist  Preanesthetic Checklist Completed: patient identified, site marked, surgical consent, pre-op evaluation, timeout performed, IV checked, risks and benefits discussed and monitors and equipment checked Spinal Block Patient position: sitting Prep: DuraPrep Patient monitoring: heart rate, cardiac monitor, continuous pulse ox and blood pressure Approach: midline Location: L3-4 Injection technique: single-shot Needle Needle type: Sprotte  Needle gauge: 24 G Needle length: 9 cm Needle insertion depth: 6 cm Assessment Sensory level: T4

## 2012-04-23 NOTE — Anesthesia Postprocedure Evaluation (Signed)
Anesthesia Post Note  Patient: Julia [name]  Procedure(s) Performed: Procedure(s) (LRB): CESAREAN SECTION (N/A)  Anesthesia type: Spinal  Patient location: PACU  Post pain: Pain level controlled  Post assessment: Post-op Vital signs reviewed  Last Vitals:  Filed Vitals:   04/23/12 1545  BP: 126/91  Pulse: 58  Temp:   Resp: 18    Post vital signs: Reviewed  Level of consciousness: awake  Complications: No apparent anesthesia complications

## 2012-04-23 NOTE — Op Note (Signed)
Cesarean Section Procedure Note  Indications: patient declines vag del attempt and previous uterine incision low transverse  Pre-operative Diagnosis: 39 week 6 day pregnancy.  Post-operative Diagnosis: same  Surgeon: [name].   Assistants: Dr Neva Seat  Anesthesia: Spinal anesthesia  ASA Class: na   Procedure Details   The patient was seen in the Holding Room. The risks, benefits, complications, treatment options, and expected outcomes were discussed with the patient.  The patient concurred with the proposed plan, giving informed consent.  . The patient was taken to Operating Room # 1, identified as Julia [name] and the procedure verified as C-Section Delivery. A Time Out was held and the above information confirmed.  Antibiotics were preoperatively and SCDs were on.  After induction of anesthesia, the patient was draped and prepped in the usual sterile manner. A Pfannenstiel incision was made and carried down through the subcutaneous tissue to the fascia. Fascial incision was made and extended transversely with Bovie electrocautery. The fascia was separated from the underlying rectus tissue superiorly and inferiorly bluntly and with the Bovie electrocautery as well. The peritoneum was identified and entered. Peritoneal incision was extended longitudinally. The utero-vesical peritoneal reflection was incised transversely and the bladder flap was bluntly freed from the lower uterine segment. A low transverse uterine incision was made. Delivered from cephalic presentation was a n/a gram Female with Apgar scores of 9 at one minute and 9 at five minutes. After the umbilical cord was clamped and cut cord blood was obtained for evaluation. The placenta was removed intact and appeared normal. The uterine outline, tubes and ovaries appeared normal except for peritubal cyst and mesenteric cyst that was noted both of these removed and sent for pathology separately there was a small posterior fundal  fibroid noted approximately 2 cm and subserosal.  The uterus was exteriorized.  Hysterotomy was completely within the lower uterine segment.  A well-developed lower uterine segment was noted.. The uterine incision was closed with running locked sutures of 0 Vicryl.  In 2 layers and a single tie figure-of-eight at the left portion of approximately 1/3 margin was utilized create hemostasis. Hemostasis was observed. Lavage was carried out until clear of the upper abdomen. The fascia was then reapproximated with running sutures of 0 Vicryl. The skin was reapproximated with 3-0 Monocryl.  In a subcuticular fashion.  The wound was appropriately dressed.  Instrument, sponge, and needle counts were correct prior the abdominal closure and at the conclusion of the case.   Findings: Viable female infant with Apgars of 9 and 9 at one and 5 minutes respectively with a well-developed lower uterine segment and good hemostasis postoperatively.  Good fascial integrity was noted postoperatively.  Both ovaries and both tubes within normal limits except for peritubal cyst noted on the left which looked to be hemorrhagic as well and this was sent to pathology as well..  There were clear adenomatous appearing cyst attached to the omentum.  A specimen was obtained for pathology.  The placenta appeared to be within normal limits removed in its entirety.  Three-vessel cord noted.  Cord blood was obtained.  The mother tolerated the procedure well was when the baby upon exiting the  Estimated Blood Loss:  800 mL         Drains: Foley to gravity        Urine output approximately 50 mL and clear Total IV Fluids:  3000 ml         Specimens: Left paratubal cyst that appeared to  be hemorrhagic.  And a clear cyst attached to the omental edge.           Implants: None         Complications:  None; patient tolerated the procedure well.         Disposition: PACU - hemodynamically stable.         Condition: stable  Attending  Attestation: I performed the procedure.

## 2012-04-23 NOTE — H&P (Signed)
  I have seen the patient and plan of care is the same and understood and pt aware of risks, benefits and alternatives.

## 2012-04-23 NOTE — Anesthesia Preprocedure Evaluation (Signed)
Anesthesia Evaluation  Patient identified by MRN, date of birth, ID band Patient awake    Reviewed: Allergy & Precautions, H&P , NPO status , Patient's Chart, lab work & pertinent test results  Airway Mallampati: II TM Distance: >3 FB Neck ROM: full    Dental No notable dental hx.    Pulmonary neg pulmonary ROS,    Pulmonary exam normal       Cardiovascular negative cardio ROS  Rate:Normal     Neuro/Psych PSYCHIATRIC DISORDERS Depression negative neurological ROS  negative psych ROS   GI/Hepatic negative GI ROS, Neg liver ROS,   Endo/Other  Morbid obesity  Renal/GU negative Renal ROS  negative genitourinary   Musculoskeletal   Abdominal (+) + obese,   Peds  Hematology negative hematology ROS (+)   Anesthesia Other Findings   Reproductive/Obstetrics (+) Pregnancy                           Anesthesia Physical Anesthesia Plan  ASA: III  Anesthesia Plan: Spinal   Post-op Pain Management:    Induction:   Airway Management Planned:   Additional Equipment:   Intra-op Plan:   Post-operative Plan:   Informed Consent: I have reviewed the patients History and Physical, chart, labs and discussed the procedure including the risks, benefits and alternatives for the proposed anesthesia with the patient or authorized representative who has indicated his/her understanding and acceptance.     Plan Discussed with: CRNA and Surgeon  Anesthesia Plan Comments:         Anesthesia Quick Evaluation

## 2012-04-23 NOTE — Transfer of Care (Signed)
Immediate Anesthesia Transfer of Care Note  Patient: Julia [name]  Procedure(s) Performed: Procedure(s) (LRB): CESAREAN SECTION (N/A)  Patient Location: PACU  Anesthesia Type: Spinal  Level of Consciousness: awake  Airway & Oxygen Therapy: Patient Spontanous Breathing  Post-op Assessment: Report given to PACU RN  Post vital signs: Reviewed and stable  Complications: No apparent anesthesia complications

## 2012-04-24 ENCOUNTER — Encounter (HOSPITAL_COMMUNITY): Payer: Self-pay | Admitting: Obstetrics & Gynecology

## 2012-04-24 LAB — CBC
HCT: 29.1 % — ABNORMAL LOW (ref 36.0–46.0)
MCHC: 33.3 g/dL (ref 30.0–36.0)
Platelets: 225 10*3/uL (ref 150–400)
RDW: 13.8 % (ref 11.5–15.5)
WBC: 7.9 10*3/uL (ref 4.0–10.5)

## 2012-04-24 NOTE — Progress Notes (Signed)
Subjective: Postpartum Day 1: Cesarean Delivery Patient reports incisional pain and tolerating PO.  Pain controlled with medications.  Objective: Vital signs in last 24 hours: Temp:  [97.1 F (36.2 C)-98.7 F (37.1 C)] 97.6 F (36.4 C) (07/04 0600) Pulse Rate:  [50-82] 50  (07/04 0600) Resp:  [10-22] 18  (07/04 0600) BP: (112-151)/(63-91) 117/68 mmHg (07/04 0600) SpO2:  [99 %-100 %] 100 % (07/04 0600) Weight:  [94.802 kg (209 lb)] 94.802 kg (209 lb) (07/03 1530)  Physical Exam:  General: alert, cooperative and no distress Lochia: Not assessed Uterine Fundus: firm Incision: Dressing clean and dry. DVT Evaluation: No evidence of DVT seen on physical exam.   Basename 04/24/12 0546  HGB 9.7*  HCT 29.1*    Assessment/Plan: Status post Cesarean section. Doing well postoperatively.  Continue current care. Encouraged ambulation. Julia [name] 04/24/2012, 11:21 AM

## 2012-04-24 NOTE — Progress Notes (Signed)
UR chart review completed.  

## 2012-04-24 NOTE — Progress Notes (Signed)
Patient was referred for history of depression/anxiety.  * Referral screened out by Clinical Social Worker because none of the following criteria appear to apply:  ~ History of anxiety/depression during this pregnancy, or of post-partum depression.  ~ Diagnosis of anxiety and/or depression within last 3 years  ~ History of depression due to pregnancy loss/loss of child  OR  * Patient's symptoms currently being treated with medication and/or therapy.  Please contact the Clinical Social Worker if needs arise, or by the patient's request.  Depression hasn't been an issue in years, as per pt.

## 2012-04-25 MED ORDER — FERROUS SULFATE 325 (65 FE) MG PO TABS
325.0000 mg | ORAL_TABLET | Freq: Every day | ORAL | Status: DC
  Administered 2012-04-25 – 2012-04-26 (×2): 325 mg via ORAL
  Filled 2012-04-25 (×2): qty 1

## 2012-04-25 MED ORDER — FERROUS SULFATE 220 (44 FE) MG/5ML PO ELIX: 220.0000 mg | ORAL_SOLUTION | Freq: Every day | ORAL | Status: DC

## 2012-04-25 NOTE — Progress Notes (Signed)
Subjective: Postpartum Day 2 Cesarean Delivery Patient reports tolerating PO, + flatus and no problems voiding.    Objective: Vital signs in last 24 hours: Temp:  [97.9 F (36.6 C)-99.2 F (37.3 C)] 99.2 F (37.3 C) (07/05 0550) Pulse Rate:  [58-65] 65  (07/05 0550) Resp:  [18-20] 18  (07/05 0550) BP: (110-135)/(68-90) 110/68 mmHg (07/05 0550)  Physical Exam:  General: alert, cooperative and no distress Lochia: appropriate Uterine Fundus: firm Incision: healing well, no significant drainage DVT Evaluation: No evidence of DVT seen on physical exam.   Basename 04/24/12 0546  HGB 9.7*  HCT 29.1*    Assessment/Plan: Status post Cesarean section. Doing well postoperatively.  Continue current care Iron tablet qd.Marland Kitchen  Pio Eatherly E 04/25/2012, 1:12 PM

## 2012-04-26 NOTE — Discharge Summary (Signed)
Physician Discharge Summary  Patient ID: Julia [name] MRN: [id number] DOB/AGE: June 26, 1981 31 y.o.  Admit date: 04/23/2012 Discharge date: 04/26/2012  Admission Diagnoses: term pregnancy, prior Cesarean  Discharge Diagnoses:  Active Problems:  * No active hospital problems. *    Discharged Condition: good  Hospital Course: Underwent repeat Cesarean. Post op course uncomplicated.  Consults: None  Significant Diagnostic Studies: none  Treatments: Routine post op  Discharge Exam: Blood pressure 116/75, pulse 61, temperature 97.8 F (36.6 C), temperature source Oral, resp. rate 18, weight 94.802 kg (209 lb), last menstrual period 06/01/2011, SpO2 100.00%, unknown if currently breastfeeding. General appearance: alert, cooperative and no distress Resp: clear to auscultation bilaterally Cardio: S1: normal and S2: normal GI: soft, non-tender; bowel sounds normal; no masses,  no organomegaly Extremities: extremities normal, atraumatic, no cyanosis or edema Incision/Wound: normal  Disposition: 01-Home or Self Care  Discharge Orders    Future Orders Please Complete By Expires   Diet - low sodium heart healthy      No dressing needed      Call MD for:  temperature >100.4      Call MD for:  persistant nausea and vomiting      Call MD for:  severe uncontrolled pain      Call MD for:  redness, tenderness, or signs of infection (pain, swelling, redness, odor or green/yellow discharge around incision site)      Call MD for:  difficulty breathing, headache or visual disturbances      Call MD for:  hives      Call MD for:  persistant dizziness or light-headedness      Call MD for:  extreme fatigue        Medication List  As of 04/26/2012 12:39 PM   STOP taking these medications         ibuprofen 200 MG tablet      metroNIDAZOLE 500 MG tablet         TAKE these medications         ferrous sulfate 325 (65 FE) MG tablet   Take 325 mg by mouth 2 (two) times daily with a meal.     fish oil-omega-3 fatty acids 1000 MG capsule   Take 1 g by mouth 2 (two) times daily.      prenatal multivitamin Tabs   Take 1 tablet by mouth daily.             SignedFortino Sic 04/26/2012, 12:39 PM

## 2012-04-26 NOTE — Progress Notes (Signed)
Subjective: Postpartum Day 3*: Cesarean Delivery Patient reports tolerating PO and no problems voiding.    Objective: Vital signs in last 24 hours: Temp:  [97.8 F (36.6 C)-98.4 F (36.9 C)] 97.8 F (36.6 C) (07/06 0641) Pulse Rate:  [56-68] 61  (07/06 0641) Resp:  [18] 18  (07/06 0641) BP: (116-127)/(74-82) 116/75 mmHg (07/06 1610)  Physical Exam:  General: alert and no distress Lochia: appropriate Uterine Fundus: firm Incision: no significant drainage DVT Evaluation: No evidence of DVT seen on physical exam.   Basename 04/24/12 0546  HGB 9.7*  HCT 29.1*    Assessment/Plan: Status post Cesarean section. Doing well postoperatively.  Discharge home with standard precautions and return to clinic in 4-6 weeks.  Fortino Sic 04/26/2012, 12:37 PM

## 2012-05-01 ENCOUNTER — Ambulatory Visit (HOSPITAL_COMMUNITY): Admission: RE | Admit: 2012-05-01 | Payer: Medicaid Other | Source: Ambulatory Visit

## 2012-05-14 ENCOUNTER — Emergency Department (HOSPITAL_BASED_OUTPATIENT_CLINIC_OR_DEPARTMENT_OTHER)
Admission: EM | Admit: 2012-05-14 | Discharge: 2012-05-14 | Disposition: A | Discharge status: Home / Self Care | Payer: Medicaid Other | Attending: Emergency Medicine | Admitting: Emergency Medicine

## 2012-05-14 ENCOUNTER — Encounter (HOSPITAL_BASED_OUTPATIENT_CLINIC_OR_DEPARTMENT_OTHER): Payer: Self-pay | Admitting: *Deleted

## 2012-05-14 DIAGNOSIS — Z87891 Personal history of nicotine dependence: Secondary | ICD-10-CM | POA: Insufficient documentation

## 2012-05-14 DIAGNOSIS — D649 Anemia, unspecified: Secondary | ICD-10-CM | POA: Insufficient documentation

## 2012-05-14 DIAGNOSIS — K0889 Other specified disorders of teeth and supporting structures: Secondary | ICD-10-CM

## 2012-05-14 DIAGNOSIS — K089 Disorder of teeth and supporting structures, unspecified: Secondary | ICD-10-CM | POA: Insufficient documentation

## 2012-05-14 MED ORDER — PENICILLIN V POTASSIUM 250 MG PO TABS: 250.0000 mg | ORAL_TABLET | Freq: Four times a day (QID) | ORAL | Status: AC

## 2012-05-14 MED ORDER — IBUPROFEN 800 MG PO TABS: 800.0000 mg | ORAL_TABLET | Freq: Three times a day (TID) | ORAL | Status: AC
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
## 2012-05-14 NOTE — ED Notes (Signed)
Pt c/o toothache

## 2012-05-14 NOTE — ED Provider Notes (Signed)
Medical screening examination/treatment/procedure(s) were performed by non-physician practitioner and as supervising physician I was immediately available for consultation/collaboration.    Nelia Shi, MD 05/14/12 2116
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
## 2012-05-21 ENCOUNTER — Encounter (HOSPITAL_COMMUNITY): Payer: Self-pay

## 2012-05-21 ENCOUNTER — Inpatient Hospital Stay (HOSPITAL_COMMUNITY): Admit: 2012-05-21 | Payer: Self-pay | Admitting: Obstetrics & Gynecology

## 2012-05-21 SURGERY — Surgical Case
Anesthesia: Choice

## 2012-06-12 ENCOUNTER — Other Ambulatory Visit: Payer: Self-pay | Admitting: Obstetrics & Gynecology

## 2012-06-12 ENCOUNTER — Other Ambulatory Visit (HOSPITAL_COMMUNITY)
Admission: RE | Admit: 2012-06-12 | Discharge: 2012-06-12 | Disposition: A | Discharge status: Home / Self Care | Payer: Medicaid Other | Source: Ambulatory Visit | Attending: Obstetrics & Gynecology | Admitting: Obstetrics & Gynecology

## 2012-06-12 DIAGNOSIS — Z1151 Encounter for screening for human papillomavirus (HPV): Secondary | ICD-10-CM | POA: Insufficient documentation

## 2012-06-12 DIAGNOSIS — R87810 Cervical high risk human papillomavirus (HPV) DNA test positive: Secondary | ICD-10-CM | POA: Insufficient documentation

## 2012-06-12 DIAGNOSIS — Z124 Encounter for screening for malignant neoplasm of cervix: Secondary | ICD-10-CM | POA: Insufficient documentation

## 2013-09-21 IMAGING — US US OB DETAIL+14 WK
1 series · 14 of 28 positions shown · non-contrast
Comparison: none

[Series 1: us ob detail+14 wk · 0.21mm/px · 14 of 83 slices shown]
[im 4/83]
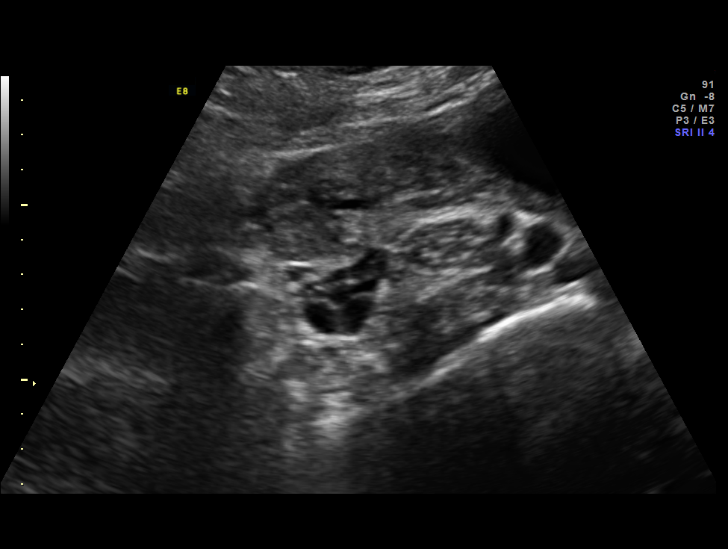
[im 10/83]
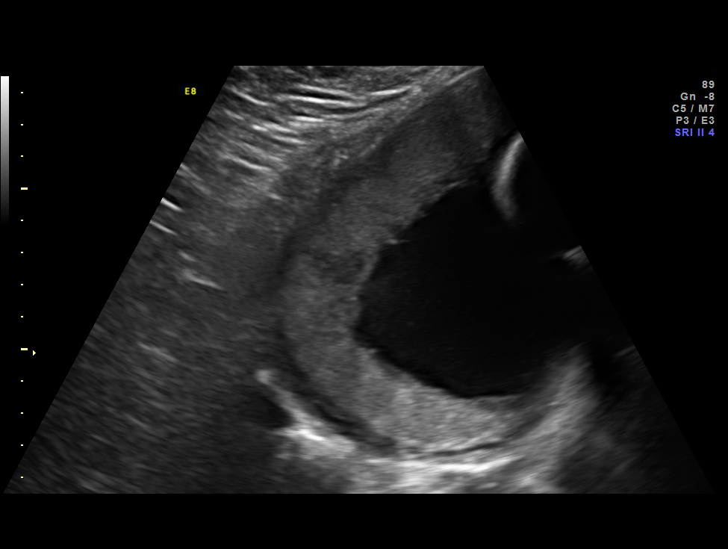
[im 16/83]
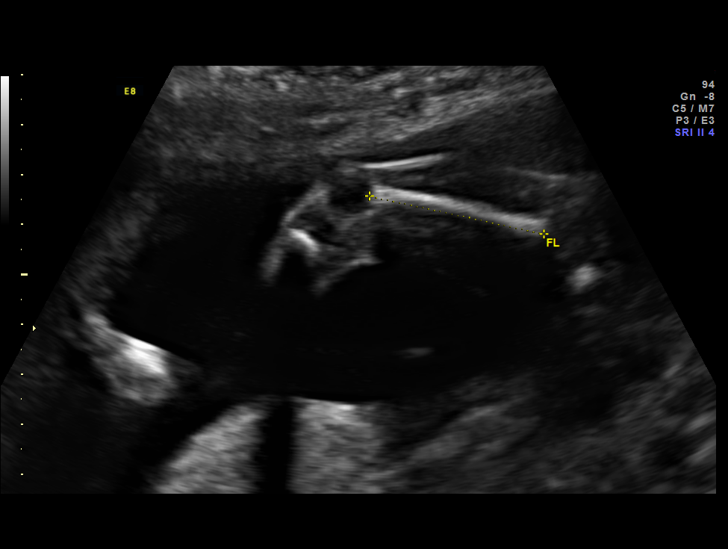
[im 22/83]
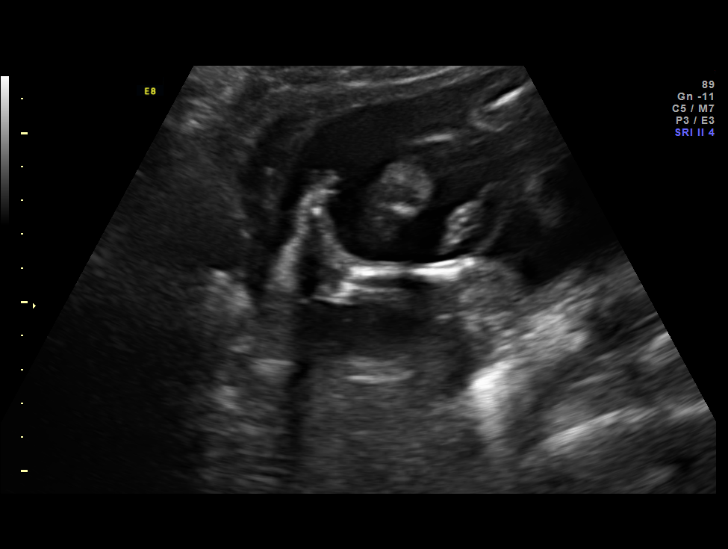
[im 28/83]
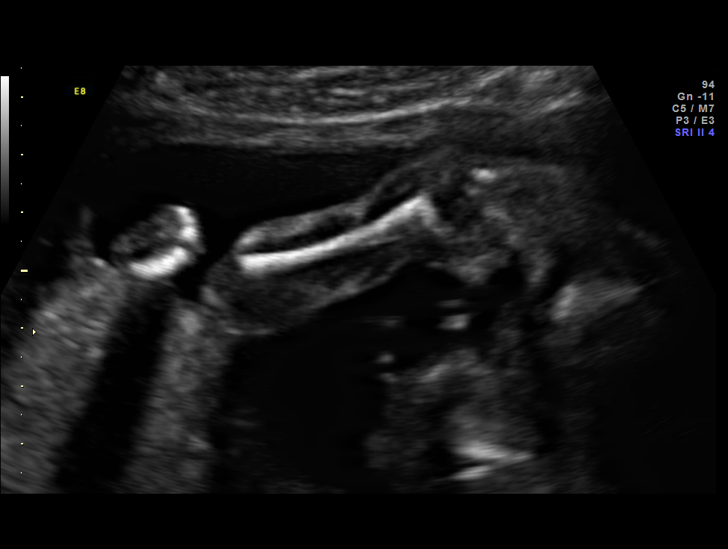
[im 34/83]
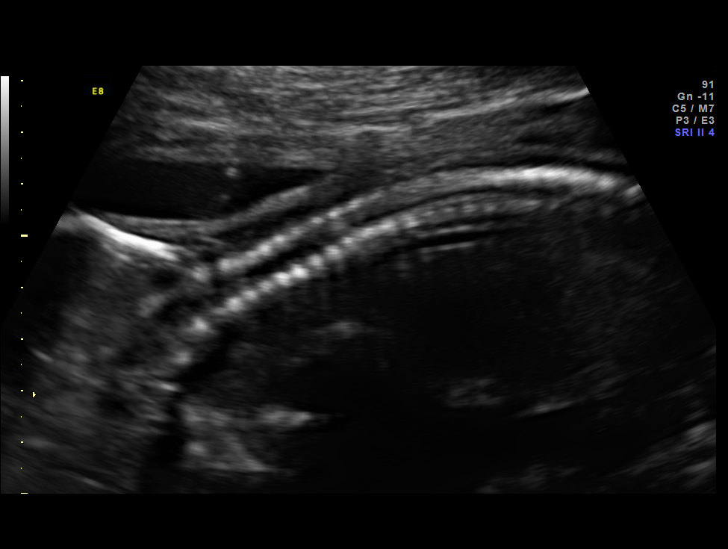
[im 40/83]
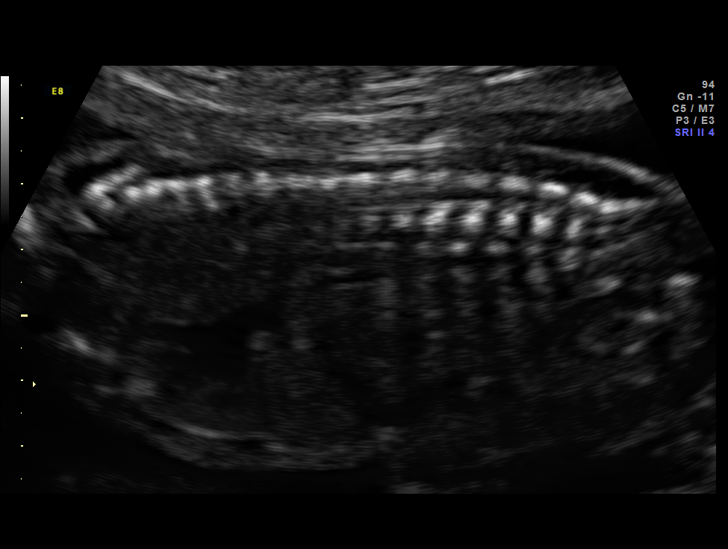
[im 46/83]
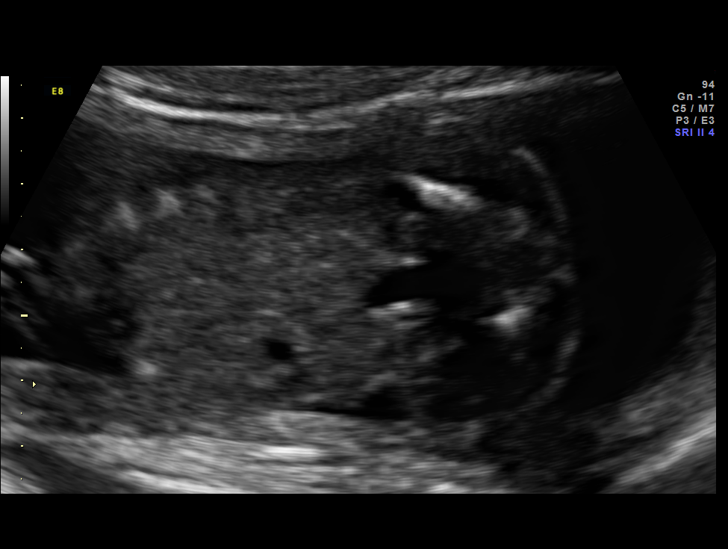
[im 52/83]
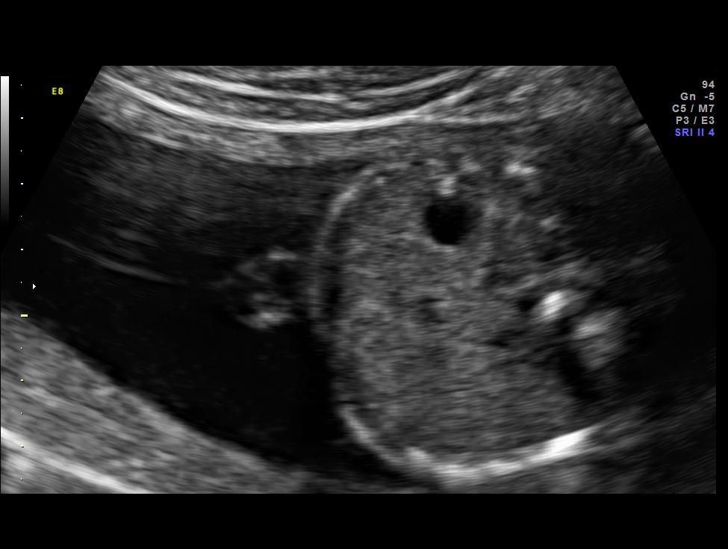
[im 58/83]
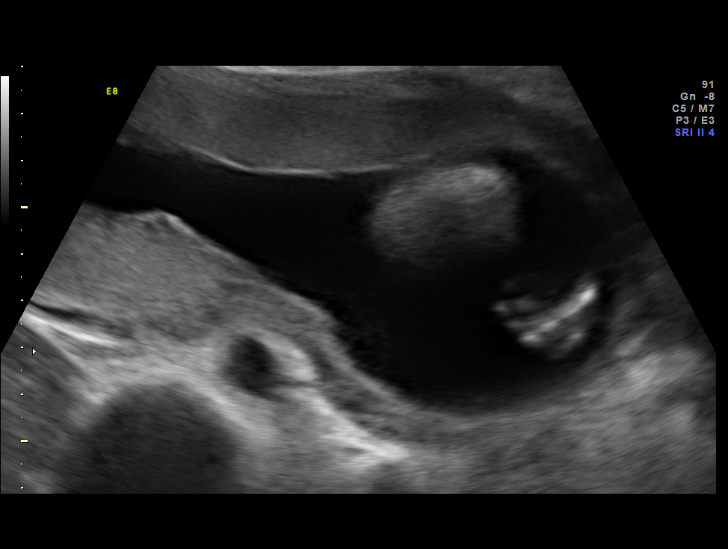
[im 64/83]
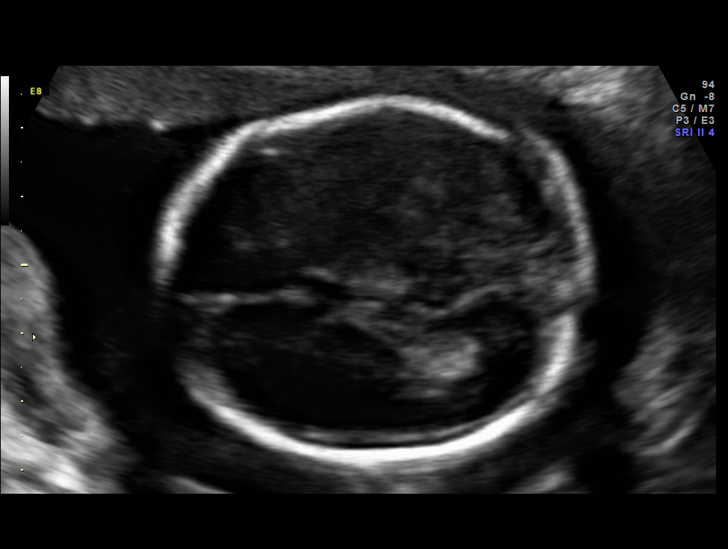
[im 70/83]
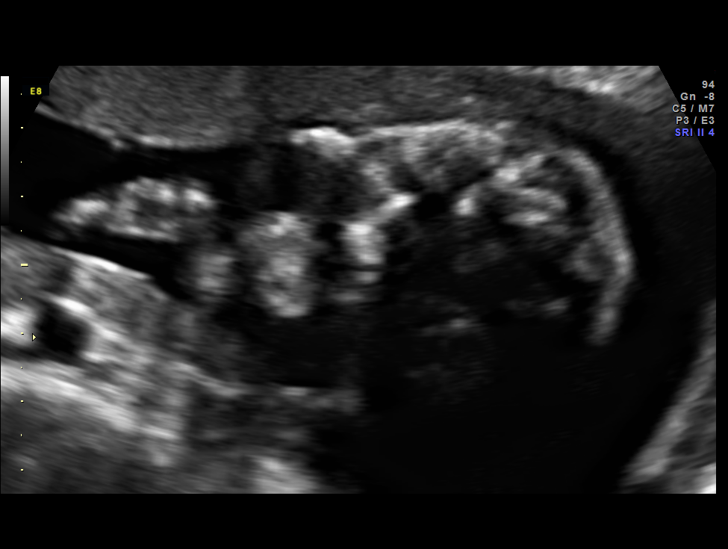
[im 76/83]
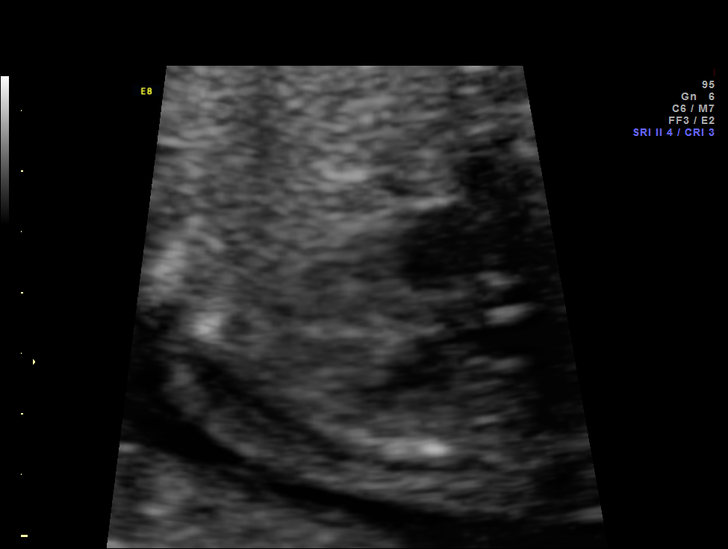
[im 83/83]
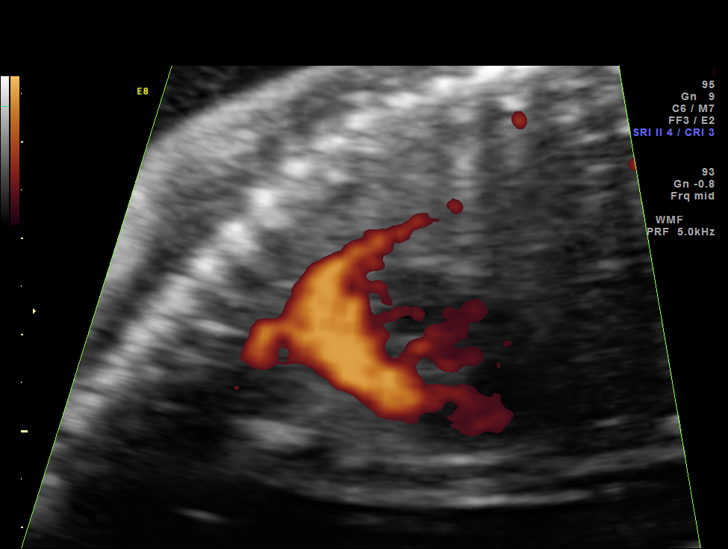

[14 of 28 positions shown; findings below may reference images not displayed]

Canned report from images found in remote index.

Refer to host system for actual result text.

## 2014-02-13 ENCOUNTER — Encounter (HOSPITAL_BASED_OUTPATIENT_CLINIC_OR_DEPARTMENT_OTHER): Payer: Self-pay | Admitting: Emergency Medicine

## 2014-02-13 ENCOUNTER — Emergency Department (HOSPITAL_BASED_OUTPATIENT_CLINIC_OR_DEPARTMENT_OTHER)
Admission: EM | Admit: 2014-02-13 | Discharge: 2014-02-14 | Disposition: A | Discharge status: Home / Self Care | Payer: Medicaid Other | Attending: Emergency Medicine | Admitting: Emergency Medicine

## 2014-02-13 ENCOUNTER — Emergency Department (HOSPITAL_BASED_OUTPATIENT_CLINIC_OR_DEPARTMENT_OTHER): Payer: Medicaid Other

## 2014-02-13 DIAGNOSIS — Z8659 Personal history of other mental and behavioral disorders: Secondary | ICD-10-CM | POA: Insufficient documentation

## 2014-02-13 DIAGNOSIS — M898X9 Other specified disorders of bone, unspecified site: Secondary | ICD-10-CM

## 2014-02-13 DIAGNOSIS — D649 Anemia, unspecified: Secondary | ICD-10-CM | POA: Insufficient documentation

## 2014-02-13 DIAGNOSIS — Z79899 Other long term (current) drug therapy: Secondary | ICD-10-CM | POA: Insufficient documentation

## 2014-02-13 DIAGNOSIS — S93402A Sprain of unspecified ligament of left ankle, initial encounter: Secondary | ICD-10-CM

## 2014-02-13 DIAGNOSIS — S93409A Sprain of unspecified ligament of unspecified ankle, initial encounter: Secondary | ICD-10-CM | POA: Insufficient documentation

## 2014-02-13 DIAGNOSIS — F17200 Nicotine dependence, unspecified, uncomplicated: Secondary | ICD-10-CM | POA: Insufficient documentation

## 2014-02-13 DIAGNOSIS — M948X9 Other specified disorders of cartilage, unspecified sites: Secondary | ICD-10-CM | POA: Insufficient documentation

## 2014-02-13 DIAGNOSIS — X500XXA Overexertion from strenuous movement or load, initial encounter: Secondary | ICD-10-CM | POA: Insufficient documentation

## 2014-02-13 DIAGNOSIS — Y9345 Activity, cheerleading: Secondary | ICD-10-CM | POA: Insufficient documentation

## 2014-02-13 DIAGNOSIS — Y990 Civilian activity done for income or pay: Secondary | ICD-10-CM | POA: Insufficient documentation

## 2014-02-13 DIAGNOSIS — Y9289 Other specified places as the place of occurrence of the external cause: Secondary | ICD-10-CM | POA: Insufficient documentation

## 2014-02-13 NOTE — ED Notes (Signed)
Left ankle pain and swelling after being lifted in a cheerleading move- denies specific injury
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
## 2014-02-14 MED ORDER — HYDROCODONE-ACETAMINOPHEN 5-325 MG PO TABS: 1.0000 | ORAL_TABLET | Freq: Four times a day (QID) | ORAL | Status: DC | PRN

## 2014-02-14 NOTE — Discharge Instructions (Signed)

## 2014-08-23 ENCOUNTER — Encounter (HOSPITAL_BASED_OUTPATIENT_CLINIC_OR_DEPARTMENT_OTHER): Payer: Self-pay | Admitting: Emergency Medicine

## 2016-03-14 ENCOUNTER — Emergency Department (HOSPITAL_BASED_OUTPATIENT_CLINIC_OR_DEPARTMENT_OTHER)
Admission: EM | Admit: 2016-03-14 | Discharge: 2016-03-14 | Discharge status: Against Medical Advice | Payer: Self-pay | Attending: Emergency Medicine | Admitting: Emergency Medicine

## 2016-03-14 ENCOUNTER — Encounter (HOSPITAL_BASED_OUTPATIENT_CLINIC_OR_DEPARTMENT_OTHER): Payer: Self-pay

## 2016-03-14 ENCOUNTER — Emergency Department (HOSPITAL_BASED_OUTPATIENT_CLINIC_OR_DEPARTMENT_OTHER): Payer: Self-pay

## 2016-03-14 DIAGNOSIS — F17210 Nicotine dependence, cigarettes, uncomplicated: Secondary | ICD-10-CM | POA: Insufficient documentation

## 2016-03-14 DIAGNOSIS — F329 Major depressive disorder, single episode, unspecified: Secondary | ICD-10-CM | POA: Insufficient documentation

## 2016-03-14 DIAGNOSIS — R109 Unspecified abdominal pain: Secondary | ICD-10-CM | POA: Insufficient documentation

## 2016-03-14 DIAGNOSIS — O99331 Smoking (tobacco) complicating pregnancy, first trimester: Secondary | ICD-10-CM | POA: Insufficient documentation

## 2016-03-14 DIAGNOSIS — O26899 Other specified pregnancy related conditions, unspecified trimester: Secondary | ICD-10-CM

## 2016-03-14 DIAGNOSIS — O26891 Other specified pregnancy related conditions, first trimester: Secondary | ICD-10-CM | POA: Insufficient documentation

## 2016-03-14 DIAGNOSIS — Z3A01 Less than 8 weeks gestation of pregnancy: Secondary | ICD-10-CM | POA: Insufficient documentation

## 2016-03-14 LAB — URINALYSIS, ROUTINE W REFLEX MICROSCOPIC
Bilirubin Urine: NEGATIVE
Glucose, UA: NEGATIVE mg/dL
Hgb urine dipstick: NEGATIVE
KETONES UR: 15 mg/dL — AB
LEUKOCYTES UA: NEGATIVE
Nitrite: NEGATIVE
PROTEIN: NEGATIVE mg/dL
Specific Gravity, Urine: 1.026 (ref 1.005–1.030)
pH: 6.5 (ref 5.0–8.0)

## 2016-03-14 LAB — PREGNANCY, URINE: PREG TEST UR: POSITIVE — AB

## 2016-03-14 LAB — HCG, QUANTITATIVE, PREGNANCY: hCG, Beta Chain, Quant, S: 86478 m[IU]/mL — ABNORMAL HIGH (ref <5)

## 2016-03-14 NOTE — ED Notes (Signed)
Patient transported to Ultrasound 

## 2016-03-14 NOTE — ED Notes (Signed)
Pt states she had pos preg test at home May 2nd-c/o abd cramping, n/v and spasms-denies vaginal bleeding-no medical f/u-OB appt June 5-G3 P2-NAD-steady gait

## 2016-03-14 NOTE — ED Notes (Signed)
Pt has children at home and is unable to wait for results from ultrasound.  Pt is not in acute distress, verbalizes risks of leaving AMA.  Pt's contact number is correct should US come back emergent.
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
## 2016-04-09 ENCOUNTER — Ambulatory Visit (INDEPENDENT_AMBULATORY_CARE_PROVIDER_SITE_OTHER): Payer: Medicaid Other | Admitting: Obstetrics

## 2016-04-09 ENCOUNTER — Encounter: Payer: Self-pay | Admitting: Obstetrics

## 2016-04-09 VITALS — BP 107/69 | HR 80 | Temp 98.5°F | Wt 171.6 lb

## 2016-04-09 DIAGNOSIS — Z3491 Encounter for supervision of normal pregnancy, unspecified, first trimester: Secondary | ICD-10-CM

## 2016-04-09 DIAGNOSIS — R87612 Low grade squamous intraepithelial lesion on cytologic smear of cervix (LGSIL): Secondary | ICD-10-CM | POA: Insufficient documentation

## 2016-04-09 DIAGNOSIS — Z331 Pregnant state, incidental: Secondary | ICD-10-CM

## 2016-04-09 DIAGNOSIS — Z1389 Encounter for screening for other disorder: Secondary | ICD-10-CM

## 2016-04-09 LAB — POCT URINALYSIS DIPSTICK
Bilirubin, UA: NEGATIVE
Blood, UA: NEGATIVE
Glucose, UA: NEGATIVE
Ketones, UA: NEGATIVE
LEUKOCYTES UA: NEGATIVE
Nitrite, UA: NEGATIVE
Spec Grav, UA: 1.01
UROBILINOGEN UA: NEGATIVE
pH, UA: 7

## 2016-04-09 MED ORDER — VITAFOL FE+ 90-1-200 & 50 MG PO CPPK: 1.0000 | ORAL_CAPSULE | Freq: Every day | ORAL | Status: DC

## 2016-04-09 NOTE — Addendum Note (Signed)
Addended by: Elby BeckPAUL, Vinetta Brach F on: 04/09/2016 02:12 PM   Modules accepted: Orders

## 2016-04-09 NOTE — Progress Notes (Signed)
Patient is having painful cramping that stops her in her tracks. She has pinkish bloody show late at night /end of day. Patient is prone to BV- and thinks she may have occurrence.
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
## 2016-04-10 LAB — PAP IG W/ RFLX HPV ASCU: PAP Smear Comment: 0

## 2016-04-11 LAB — PRENATAL PROFILE I(LABCORP)
Antibody Screen: NEGATIVE
BASOS ABS: 0 10*3/uL (ref 0.0–0.2)
Basos: 1 %
EOS (ABSOLUTE): 0.2 10*3/uL (ref 0.0–0.4)
EOS: 4 %
HEP B S AG: NEGATIVE
Hematocrit: 33.4 % — ABNORMAL LOW (ref 34.0–46.6)
Hemoglobin: 11.1 g/dL (ref 11.1–15.9)
IMMATURE GRANS (ABS): 0 10*3/uL (ref 0.0–0.1)
IMMATURE GRANULOCYTES: 0 %
LYMPHS: 32 %
Lymphocytes Absolute: 1.3 10*3/uL (ref 0.7–3.1)
MCH: 28.5 pg (ref 26.6–33.0)
MCHC: 33.2 g/dL (ref 31.5–35.7)
MCV: 86 fL (ref 79–97)
MONOCYTES: 11 %
Monocytes Absolute: 0.5 10*3/uL (ref 0.1–0.9)
NEUTROS PCT: 52 %
Neutrophils Absolute: 2.2 10*3/uL (ref 1.4–7.0)
PLATELETS: 308 10*3/uL (ref 150–379)
RBC: 3.89 x10E6/uL (ref 3.77–5.28)
RDW: 13.1 % (ref 12.3–15.4)
RH TYPE: POSITIVE
RPR Ser Ql: NONREACTIVE
Rubella Antibodies, IGG: 3.56 index (ref >0.99)
WBC: 4.2 10*3/uL (ref 3.4–10.8)

## 2016-04-11 LAB — HEMOGLOBINOPATHY EVALUATION
HEMOGLOBIN A2 QUANTITATION: 2.4 % (ref 0.7–3.1)
HEMOGLOBIN F QUANTITATION: 0 % (ref 0.0–2.0)
HGB A: 97.6 % (ref 94.0–98.0)
HGB C: 0 %
HGB S: 0 %

## 2016-04-11 LAB — URINE CULTURE, OB REFLEX: Organism ID, Bacteria: NO GROWTH

## 2016-04-11 LAB — CULTURE, OB URINE

## 2016-04-11 LAB — VITAMIN D 25 HYDROXY (VIT D DEFICIENCY, FRACTURES): VIT D 25 HYDROXY: 25.2 ng/mL — AB (ref 30.0–100.0)

## 2016-04-11 LAB — VARICELLA ZOSTER ANTIBODY, IGG: Varicella zoster IgG: 840 index (ref >165)

## 2016-04-11 LAB — HIV ANTIBODY (ROUTINE TESTING W REFLEX): HIV Screen 4th Generation wRfx: NONREACTIVE

## 2016-04-12 ENCOUNTER — Other Ambulatory Visit: Payer: Self-pay | Admitting: Obstetrics

## 2016-04-12 DIAGNOSIS — B9689 Other specified bacterial agents as the cause of diseases classified elsewhere: Secondary | ICD-10-CM

## 2016-04-12 DIAGNOSIS — B3731 Acute candidiasis of vulva and vagina: Secondary | ICD-10-CM

## 2016-04-12 DIAGNOSIS — N760 Acute vaginitis: Principal | ICD-10-CM

## 2016-04-12 DIAGNOSIS — B373 Candidiasis of vulva and vagina: Secondary | ICD-10-CM

## 2016-04-12 LAB — NUSWAB VG+, CANDIDA 6SP
Atopobium vaginae: HIGH Score — AB
BVAB 2: HIGH {score} — AB
CANDIDA GLABRATA, NAA: NEGATIVE
CANDIDA KRUSEI, NAA: NEGATIVE
CANDIDA TROPICALIS, NAA: NEGATIVE
Candida albicans, NAA: NEGATIVE
Candida lusitaniae, NAA: POSITIVE — AB
Candida parapsilosis, NAA: NEGATIVE
Chlamydia trachomatis, NAA: NEGATIVE
MEGASPHAERA 1: HIGH {score} — AB
NEISSERIA GONORRHOEAE, NAA: NEGATIVE
Trich vag by NAA: NEGATIVE

## 2016-04-12 MED ORDER — FLUCONAZOLE 150 MG PO TABS: 150.0000 mg | ORAL_TABLET | Freq: Once | ORAL | Status: DC

## 2016-04-12 MED ORDER — TERCONAZOLE 0.4 % VA CREA: 1.0000 | TOPICAL_CREAM | Freq: Every day | VAGINAL | Status: DC

## 2016-04-12 MED ORDER — METRONIDAZOLE 500 MG PO TABS: 500.0000 mg | ORAL_TABLET | Freq: Two times a day (BID) | ORAL | Status: DC

## 2016-04-19 ENCOUNTER — Telehealth: Payer: Self-pay | Admitting: *Deleted

## 2016-04-19 NOTE — Telephone Encounter (Signed)
Pt called to office regarding medications.  Return call to pt.  Pt states that she does not have coverage for Rx with her current Medicaid. Pt was advised to contact case worker to change to regular Medicaid for Rx coverage.  Please advise on pt status / medication problem.  Tinidazole samples??

## 2016-04-19 NOTE — Telephone Encounter (Signed)
[facility]  We do not have samples. Encourage patient to get regular Medicaid.

## 2016-05-02 ENCOUNTER — Telehealth: Payer: Self-pay | Admitting: *Deleted

## 2016-05-03 NOTE — Telephone Encounter (Signed)
See telephone note for this encounter.

## 2016-05-07 ENCOUNTER — Encounter: Payer: Self-pay | Admitting: Obstetrics

## 2016-05-07 ENCOUNTER — Ambulatory Visit (INDEPENDENT_AMBULATORY_CARE_PROVIDER_SITE_OTHER): Payer: Medicaid Other | Admitting: Obstetrics

## 2016-05-07 VITALS — BP 105/70 | HR 75 | Temp 98.1°F | Wt 174.6 lb

## 2016-05-07 DIAGNOSIS — Z3491 Encounter for supervision of normal pregnancy, unspecified, first trimester: Secondary | ICD-10-CM | POA: Diagnosis not present

## 2016-05-07 DIAGNOSIS — Z331 Pregnant state, incidental: Secondary | ICD-10-CM | POA: Diagnosis not present

## 2016-05-07 DIAGNOSIS — A499 Bacterial infection, unspecified: Secondary | ICD-10-CM | POA: Diagnosis not present

## 2016-05-07 DIAGNOSIS — Z3492 Encounter for supervision of normal pregnancy, unspecified, second trimester: Secondary | ICD-10-CM

## 2016-05-07 DIAGNOSIS — B3731 Acute candidiasis of vulva and vagina: Secondary | ICD-10-CM

## 2016-05-07 DIAGNOSIS — N760 Acute vaginitis: Secondary | ICD-10-CM

## 2016-05-07 DIAGNOSIS — B373 Candidiasis of vulva and vagina: Secondary | ICD-10-CM | POA: Diagnosis not present

## 2016-05-07 DIAGNOSIS — B9689 Other specified bacterial agents as the cause of diseases classified elsewhere: Secondary | ICD-10-CM

## 2016-05-07 LAB — POCT URINALYSIS DIPSTICK
Bilirubin, UA: NEGATIVE
Blood, UA: NEGATIVE
Glucose, UA: NEGATIVE
Ketones, UA: NEGATIVE
LEUKOCYTES UA: NEGATIVE
NITRITE UA: NEGATIVE
PH UA: 6
PROTEIN UA: NEGATIVE
Spec Grav, UA: 1.02
UROBILINOGEN UA: NEGATIVE

## 2016-05-07 MED ORDER — VITAFOL FE+ 90-1-200 & 50 MG PO CPPK: 2.0000 | ORAL_CAPSULE | Freq: Every day | ORAL | Status: DC

## 2016-05-07 MED ORDER — METRONIDAZOLE 500 MG PO TABS: 500.0000 mg | ORAL_TABLET | Freq: Two times a day (BID) | ORAL | Status: DC

## 2016-05-07 MED ORDER — TERCONAZOLE 0.4 % VA CREA: 1.0000 | TOPICAL_CREAM | Freq: Every day | VAGINAL | Status: DC

## 2016-05-07 NOTE — Progress Notes (Signed)
  Subjective:    Julia [name] is a 35 y.o. female being seen today for her obstetrical visit. She is at 2667w5d gestation. Patient reports: no complaints.  Problem List Items Addressed This Visit    None    Visit Diagnoses    Prenatal care, second trimester    -  Primary    Relevant Orders    POCT urinalysis dipstick    BV (bacterial vaginosis)        Relevant Medications    metroNIDAZOLE (FLAGYL) 500 MG tablet    Candida vaginitis        Relevant Medications    metroNIDAZOLE (FLAGYL) 500 MG tablet    terconazole (TERAZOL 7) 0.4 % vaginal cream    Prenatal care, first trimester        Relevant Medications    Prenat-FePoly-Metf-FA-DHA-DSS (VITAFOL FE+) 90-1-200 & 50 MG CPPK      There are no active problems to display for this patient.   Objective:     BP 105/70 mmHg  Pulse 75  Temp(Src) 98.1 F (36.7 C)  Wt 174 lb 9.6 oz (79.198 kg)  LMP 01/21/2016 (Approximate) Uterine Size: Below umbilicus     Assessment:    Pregnancy @ 6267w5d  weeks Doing well    Plan:    Problem list reviewed and updated. Labs reviewed.  Follow up in 4 weeks. FIRST/CF mutation testing/NIPT/QUAD SCREEN/fragile X/[name] population testing/Spinal muscular atrophy discussed: requested. Role of ultrasound in pregnancy discussed; fetal survey: requested. Amniocentesis discussed: not indicated.

## 2016-05-07 NOTE — Progress Notes (Signed)
Patient needs her Rx resent to pharmacy- she can get them now.

## 2016-05-22 ENCOUNTER — Encounter: Payer: Medicaid Other | Admitting: Obstetrics & Gynecology

## 2016-05-29 ENCOUNTER — Ambulatory Visit (INDEPENDENT_AMBULATORY_CARE_PROVIDER_SITE_OTHER): Payer: Medicaid Other | Admitting: Obstetrics & Gynecology

## 2016-05-29 VITALS — BP 106/54 | HR 88 | Wt 182.0 lb

## 2016-05-29 DIAGNOSIS — Z3490 Encounter for supervision of normal pregnancy, unspecified, unspecified trimester: Secondary | ICD-10-CM | POA: Insufficient documentation

## 2016-05-29 DIAGNOSIS — Z3492 Encounter for supervision of normal pregnancy, unspecified, second trimester: Secondary | ICD-10-CM

## 2016-05-29 DIAGNOSIS — O34219 Maternal care for unspecified type scar from previous cesarean delivery: Secondary | ICD-10-CM | POA: Insufficient documentation

## 2016-05-29 NOTE — Patient Instructions (Signed)
Return to clinic for any scheduled appointments or obstetric concerns, or go to [facility] for evaluation  Thank you for enrolling in MyChart. Please follow the instructions below to securely access your online medical record. MyChart allows you to send messages to your doctor, view your test results, manage appointments, and more.   How Do I Sign Up? 1. In your Internet browser, go to [employer] Address [employer] and enter [email or web address]. 2. Click on the Sign Up Now link in the Sign In box. You will see the New Member Sign Up page. 3. Enter your MyChart Access Code exactly as it appears below. You will not need to use this code after you've completed the sign-up process. If you do not sign up before the expiration date, you must request a new code.  MyChart Access Code: [email or web address] Expires: 07/28/2016  4:45 PM  4. Enter your Social Security Number ([id number]) and Date of Birth (mm/dd/yyyy) as indicated and click Submit. You will be taken to the next sign-up page. 5. Create a MyChart ID. This will be your MyChart login ID and cannot be changed, so think of one that is secure and easy to remember. 6. Create a MyChart password. You can change your password at any time. 7. Enter your Password Reset Question and Answer. This can be used at a later time if you forget your password.  8. Enter your e-mail address. You will receive e-mail notification when new information is available in MyChart. 9. Click Sign Up. You can now view your medical record.   Additional Information Remember, MyChart is NOT to be used for urgent needs. For medical emergencies, dial 911.

## 2016-05-29 NOTE — Progress Notes (Signed)
Subjective:  Julia [name] is a 35 y.o. [id number] at 7431w6d being seen today for ongoing prenatal care.  She is currently monitored for the following issues for this low-risk pregnancy and has Supervision of normal pregnancy; Previous cesarean delivery x 2; and Pap smear abnormality of cervix with LGSIL on her problem list.  Patient reports no complaints.  Contractions: Not present. Vag. Bleeding: None.  Movement: Absent. Denies leaking of fluid.   The following portions of the patient's history were reviewed and updated as appropriate: allergies, current medications, past family history, past medical history, past social history, past surgical history and problem list. Problem list updated.  Objective:   Vitals:   05/29/16 1617  BP: (!) 106/54  Pulse: 88  Weight: 182 lb (82.6 kg)    Fetal Status: Fetal Heart Rate (bpm): 152 Fundal Height: 18 cm Movement: Absent     General:  Alert, oriented and cooperative. Patient is in no acute distress.  Skin: Skin is warm and dry. No rash noted.   Cardiovascular: Normal heart rate noted  Respiratory: Normal respiratory effort, no problems with respiration noted  Abdomen: Soft, gravid, appropriate for gestational age. Pain/Pressure: Present     Pelvic:  Cervical exam deferred        Extremities: Normal range of motion.  Edema: None  Mental Status: Normal mood and affect. Normal behavior. Normal judgment and thought content.   Urinalysis: Urine Protein: Negative Urine Glucose: Negative  Assessment and Plan:  Pregnancy: [id number] at 6031w6d  1. Supervision of normal pregnancy, second trimester Quad screen and anatomy scan ordered - AFP, Quad Screen - US MFM OB COMP + 14 WK; Future No other complaints or concerns.  Routine obstetric precautions reviewed. Please refer to After Visit Summary for other counseling recommendations.  Return in about 4 weeks (around 06/26/2016) for OB Visit.   Tereso NewcomerUgonna A Torryn Hudspeth, MD

## 2016-05-30 ENCOUNTER — Encounter (HOSPITAL_COMMUNITY): Payer: Self-pay | Admitting: Obstetrics & Gynecology

## 2016-06-01 LAB — AFP, QUAD SCREEN
DIA Mom Value: 1.98
DIA VALUE (EIA): 307.33 pg/mL
DSR (By Age)    1 IN: 332
DSR (SECOND TRIMESTER) 1 IN: 1168
GESTATIONAL AGE AFP: 17.9 wk
MATERNAL AGE AT EDD: 34.5 a
MSAFP MOM: 1.07
MSAFP: 44.1 ng/mL
MSHCG Mom: 0.62
MSHCG: 15585 m[IU]/mL
OSB RISK: 10000
PDF: 0
T18 (By Age): 1:1292 {titer}
TEST RESULTS AFP: NEGATIVE
UE3 MOM: 0.77
WEIGHT: 182 [lb_av]
uE3 Value: 0.95 ng/mL

## 2016-06-04 ENCOUNTER — Ambulatory Visit (HOSPITAL_COMMUNITY)
Admission: RE | Admit: 2016-06-04 | Discharge: 2016-06-04 | Disposition: A | Discharge status: Home / Self Care | Payer: Medicaid Other | Source: Ambulatory Visit | Attending: Obstetrics & Gynecology | Admitting: Obstetrics & Gynecology

## 2016-06-04 ENCOUNTER — Other Ambulatory Visit: Payer: Self-pay | Admitting: Obstetrics & Gynecology

## 2016-06-04 DIAGNOSIS — Z3A18 18 weeks gestation of pregnancy: Secondary | ICD-10-CM

## 2016-06-04 DIAGNOSIS — Z36 Encounter for antenatal screening of mother: Secondary | ICD-10-CM | POA: Insufficient documentation

## 2016-06-04 DIAGNOSIS — O34219 Maternal care for unspecified type scar from previous cesarean delivery: Secondary | ICD-10-CM | POA: Diagnosis not present

## 2016-06-04 DIAGNOSIS — Z1389 Encounter for screening for other disorder: Secondary | ICD-10-CM

## 2016-06-04 DIAGNOSIS — Z363 Encounter for antenatal screening for malformations: Secondary | ICD-10-CM

## 2016-06-04 DIAGNOSIS — Z3492 Encounter for supervision of normal pregnancy, unspecified, second trimester: Secondary | ICD-10-CM

## 2016-06-05 ENCOUNTER — Telehealth: Payer: Self-pay | Admitting: *Deleted

## 2016-06-05 NOTE — Telephone Encounter (Signed)
-----   Message from Cherrie DistanceYalonda S Bethea, [place] sent at 06/05/2016  4:12 PM EDT ----- PT CALLED IN REQUESTING A REFILL FOR METRONIDIZOLE FOR BACTERIAL VAGINOSIS PATIENT STATED SHE FELT LIKE IT CAME BACK.Marland Kitchen..Marland Kitchen

## 2016-06-07 ENCOUNTER — Other Ambulatory Visit: Payer: Self-pay | Admitting: Obstetrics

## 2016-06-07 DIAGNOSIS — N760 Acute vaginitis: Principal | ICD-10-CM

## 2016-06-07 DIAGNOSIS — B9689 Other specified bacterial agents as the cause of diseases classified elsewhere: Secondary | ICD-10-CM

## 2016-06-07 MED ORDER — METRONIDAZOLE 500 MG PO TABS: 500.0000 mg | ORAL_TABLET | Freq: Two times a day (BID) | ORAL | 2 refills | Status: DC

## 2016-06-07 NOTE — Telephone Encounter (Signed)
Flagyl Rx 

## 2016-06-25 IMAGING — US US OB TRANSVAGINAL
1 series · 13 of 28 positions shown · non-contrast
Comparison: None.

CLINICAL DATA: Acute onset of right upper quadrant abdominal
cramping. Initial encounter.

EXAM:
OBSTETRIC <14 WK US AND TRANSVAGINAL OB US
TECHNIQUE: Both transabdominal and transvaginal ultrasound examinations were
performed for complete evaluation of the gestation as well as the
maternal uterus, adnexal regions, and pelvic cul-de-sac.
Transvaginal technique was performed to assess early pregnancy.

[Series 1: us ob transvaginal · 13 of 32 slices shown]
[im 2/32]
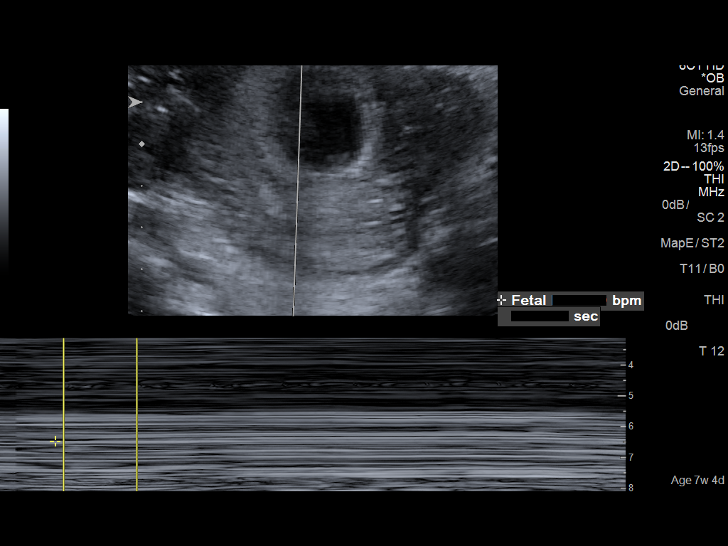
[im 4/32]
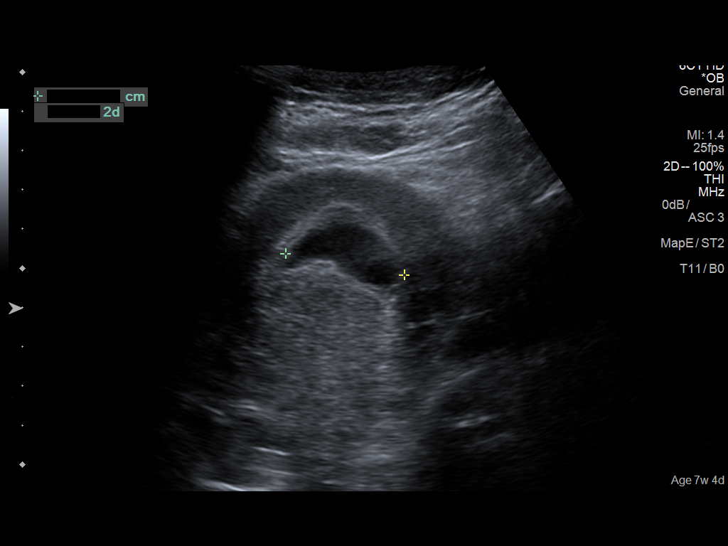
[im 6/32]
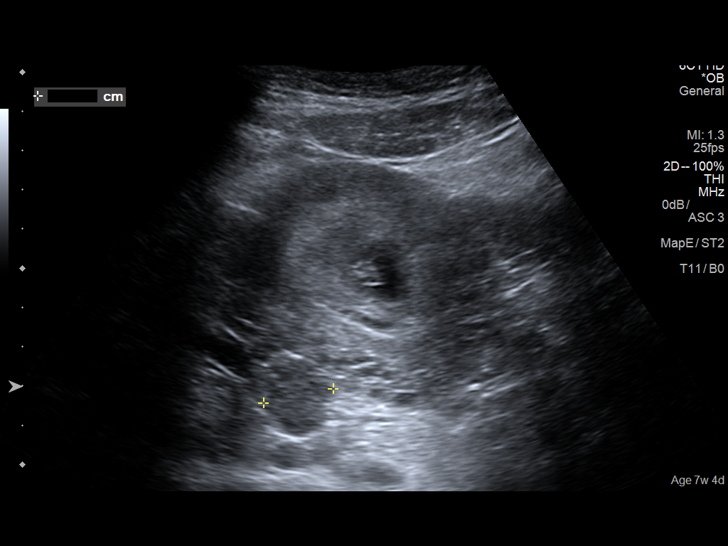
[im 9/32]
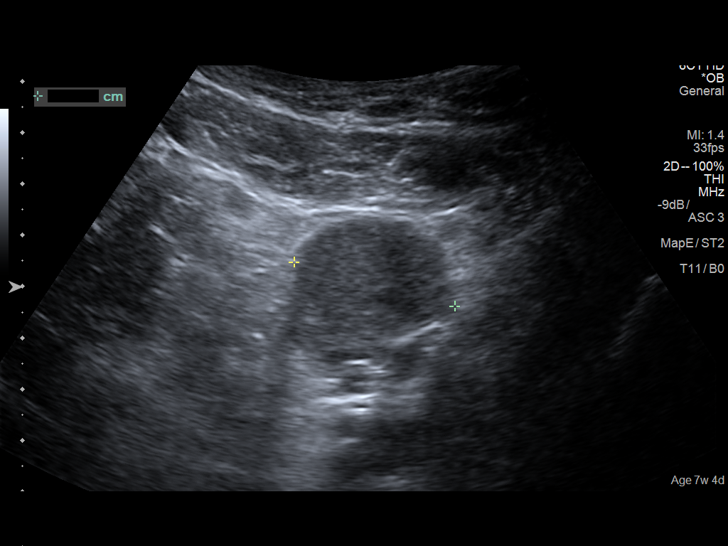
[im 11/32]
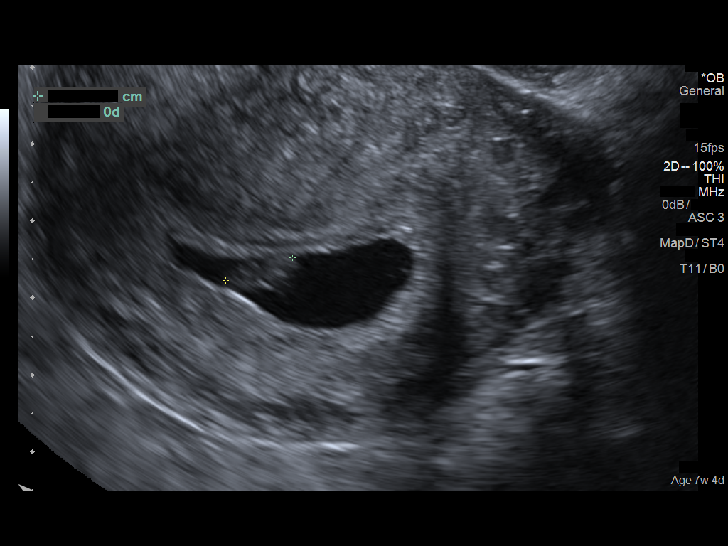
[im 13/32]
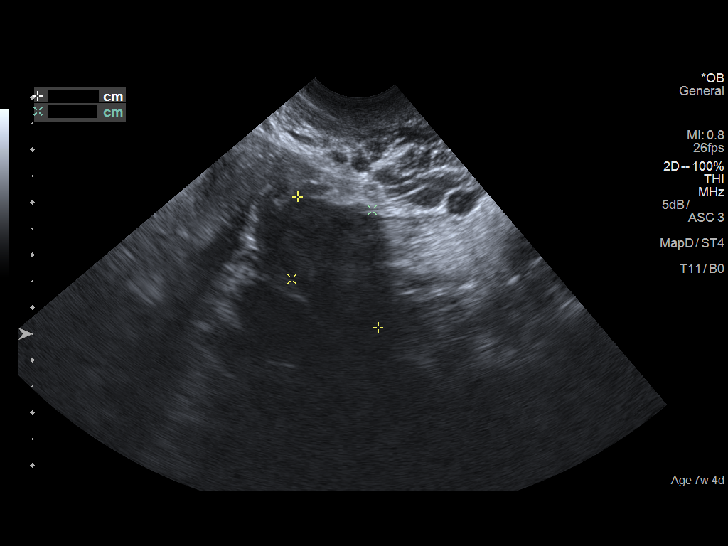
[im 17/32]
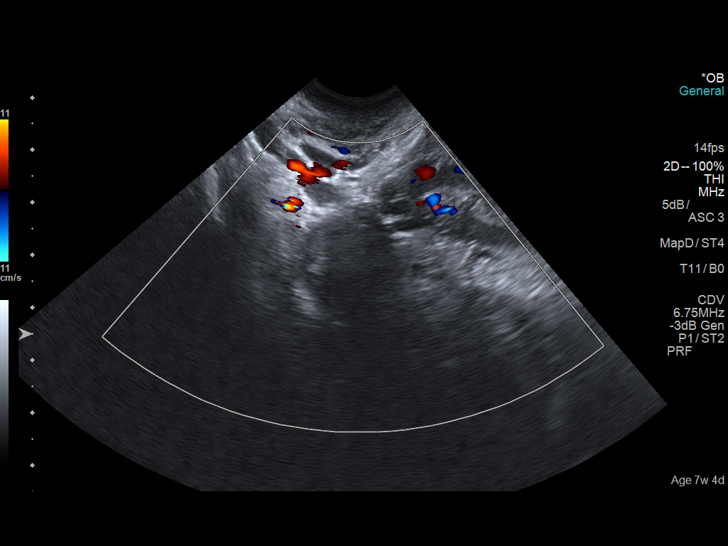
[im 19/32]
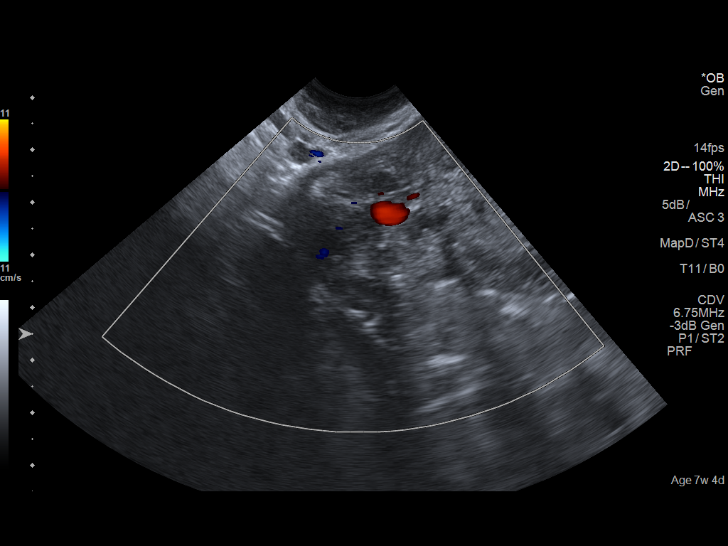
[im 21/32]
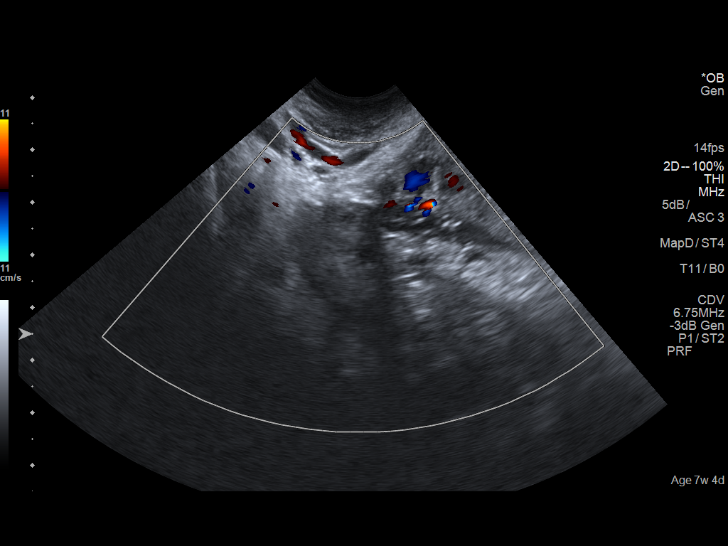
[im 23/32]
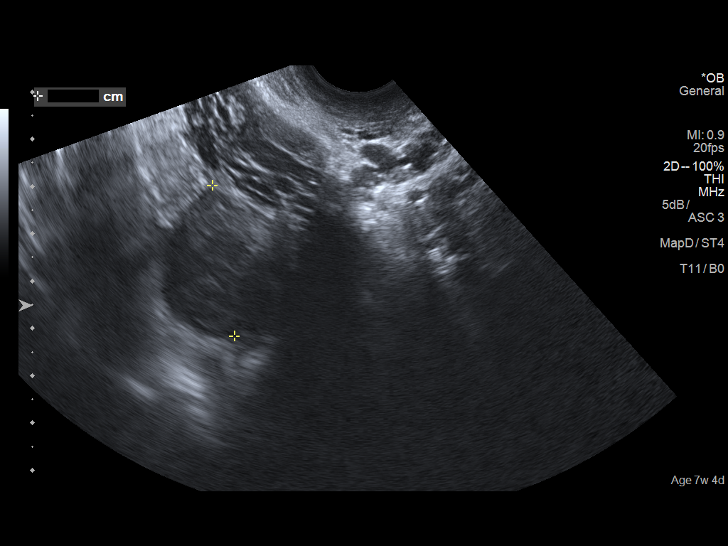
[im 26/32]
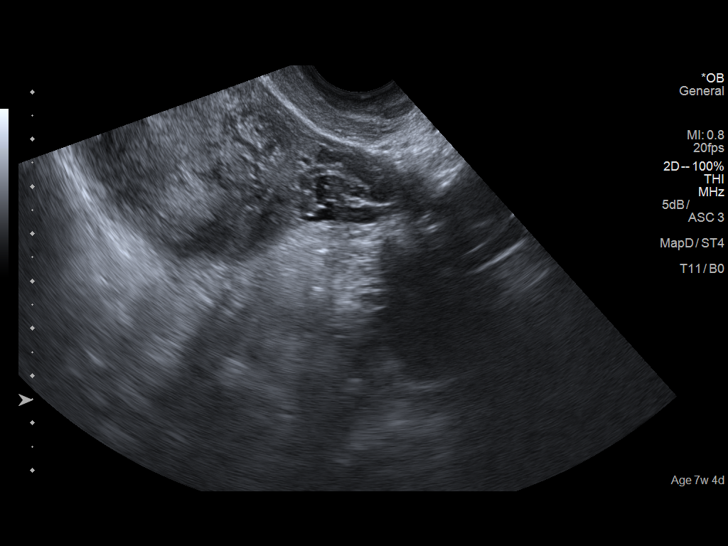
[im 28/32]
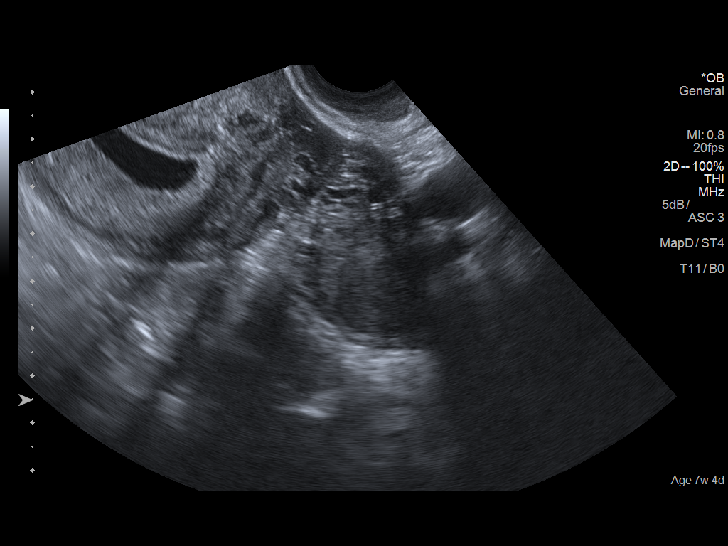
[im 30/32]
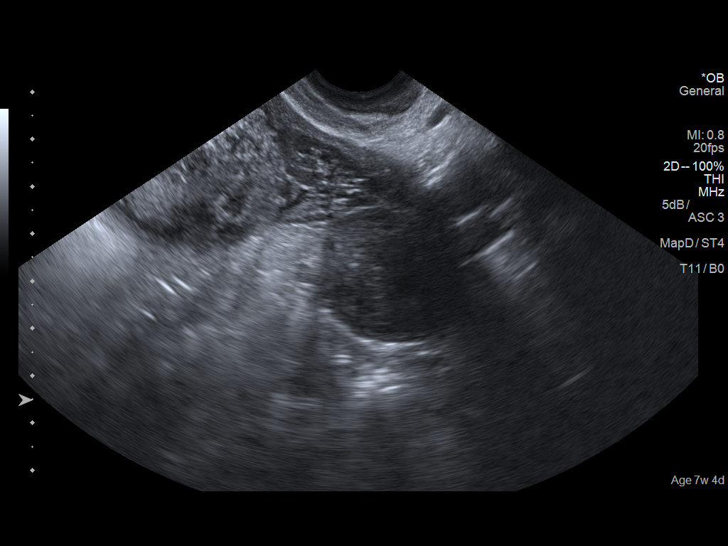

[13 of 28 positions shown; findings below may reference images not displayed]

FINDINGS: Intrauterine gestational sac: Single; visualized and normal in
shape.

Yolk sac:  Yes

Embryo:  Yes

Cardiac Activity: Yes

Heart Rate: 140  bpm

CRL:  9.5  mm   7 w   0 d                  US EDC: 10/31/2016

Subchorionic hemorrhage:  None visualized.

Maternal uterus/adnexae: The uterus is otherwise unremarkable.

The right ovary is not well seen. It measures approximately 2.9 x
2.0 x 2.0 cm. The left ovary measures approximately 3.2 x 2.5 x
cm. No suspicious adnexal masses are seen; there is no evidence for
ovarian torsion.

No free fluid is seen within the pelvic cul-de-sac.
IMPRESSION: Single live intrauterine pregnancy noted, with a crown-rump length
of 1.0 cm, corresponding to a gestational age of 7 weeks 0 days.
This matches the gestational age of 7 weeks 4 days by LMP,
reflecting an estimated date of delivery October 27, 2016.

## 2016-07-02 ENCOUNTER — Encounter: Payer: Medicaid Other | Admitting: Obstetrics & Gynecology

## 2016-07-04 ENCOUNTER — Encounter: Payer: Medicaid Other | Admitting: Obstetrics and Gynecology

## 2016-07-09 ENCOUNTER — Ambulatory Visit (INDEPENDENT_AMBULATORY_CARE_PROVIDER_SITE_OTHER): Payer: Medicaid Other | Admitting: Obstetrics & Gynecology

## 2016-07-09 VITALS — BP 121/77 | HR 80 | Temp 97.6°F | Wt 179.0 lb

## 2016-07-09 DIAGNOSIS — O99342 Other mental disorders complicating pregnancy, second trimester: Secondary | ICD-10-CM

## 2016-07-09 DIAGNOSIS — O34219 Maternal care for unspecified type scar from previous cesarean delivery: Secondary | ICD-10-CM

## 2016-07-09 DIAGNOSIS — F329 Major depressive disorder, single episode, unspecified: Secondary | ICD-10-CM

## 2016-07-09 DIAGNOSIS — F32A Depression, unspecified: Secondary | ICD-10-CM

## 2016-07-09 DIAGNOSIS — R87612 Low grade squamous intraepithelial lesion on cytologic smear of cervix (LGSIL): Secondary | ICD-10-CM

## 2016-07-09 DIAGNOSIS — O99332 Smoking (tobacco) complicating pregnancy, second trimester: Secondary | ICD-10-CM

## 2016-07-09 DIAGNOSIS — Z3492 Encounter for supervision of normal pregnancy, unspecified, second trimester: Secondary | ICD-10-CM | POA: Diagnosis not present

## 2016-07-09 DIAGNOSIS — O99330 Smoking (tobacco) complicating pregnancy, unspecified trimester: Secondary | ICD-10-CM | POA: Insufficient documentation

## 2016-07-09 MED ORDER — SERTRALINE HCL 50 MG PO TABS: 50.0000 mg | ORAL_TABLET | Freq: Every day | ORAL | 6 refills | Status: DC

## 2016-07-09 MED ORDER — SERTRALINE HCL 25 MG PO TABS: 25.0000 mg | ORAL_TABLET | Freq: Every day | ORAL | 0 refills | Status: DC

## 2016-07-09 NOTE — Patient Instructions (Signed)

## 2016-07-09 NOTE — Progress Notes (Signed)
Patient reports she has no concerns today 

## 2016-07-09 NOTE — Progress Notes (Signed)
   PRENATAL VISIT NOTE  Subjective:  Julia [name] is a 35 y.o. [id number] at 8271w5d being seen today for ongoing prenatal care.  She is currently monitored for the following issues for this high-risk pregnancy and has Supervision of normal pregnancy; Previous cesarean delivery x 2; and Pap smear abnormality of cervix with LGSIL on her problem list.  Patient reports pt c/o feeling sad and reprots emotional lability.  Her partern report increased tobacco use and emotionla outbursts. Pt denies suicidal or homicidal ideation.  Contractions: Not present. Vag. Bleeding: None.  Movement: Present. Denies leaking of fluid.   The following portions of the patient's history were reviewed and updated as appropriate: allergies, current medications, past family history, past medical history, past social history, past surgical history and problem list. Problem list updated.  Objective:   Vitals:   07/09/16 0847  BP: 121/77  Pulse: 80  Temp: 97.6 F (36.4 C)  Weight: 179 lb (81.2 kg)    Fetal Status:     Movement: Present     General:  Alert, oriented and cooperative. Patient is in no acute distress.  Skin: Skin is warm and dry. No rash noted.   Cardiovascular: Normal heart rate noted  Respiratory: Normal respiratory effort, no problems with respiration noted  Abdomen: Soft, gravid, appropriate for gestational age. Pain/Pressure: Absent     Pelvic:  Cervical exam deferred        Extremities: Normal range of motion.  Edema: None  Mental Status: Normal mood and affect. Normal behavior. Normal judgment and thought content.   Urinalysis: Urine Protein: Negative Urine Glucose: Negative  Assessment and Plan:  Pregnancy: [id number] at 2871w5d  1. Supervision of normal pregnancy, second trimester Will consider flu vaccine on next visit  2. Previous cesarean delivery x 2 Needs to be scheduled for repeat c/s at 39 weeks  3. Pap smear abnormality of cervix with LGSIL colpo PP  4. Depression affecting  pregnancy in second trimester, antepartum  - sertraline (ZOLOFT) 25 MG tablet; Take 1 tablet (25 mg total) by mouth daily.  Dispense: 7 tablet; Refill: 0 - sertraline (ZOLOFT) 50 MG tablet; Take 1 tablet (50 mg total) by mouth daily. Begin after 1 week of 25mg   Dispense: 30 tablet; Refill: 6  Pt wants a 2 week f/u to reassess depression sx.  5. Tob use pregnancy Pt wants to stop but, feels like her use in increasing with her depression sx.  She would like to have this readdressed on her next visit.     Preterm labor symptoms and general obstetric precautions including but not limited to vaginal bleeding, contractions, leaking of fluid and fetal movement were reviewed in detail with the patient. Please refer to After Visit Summary for other counseling recommendations.  Return in about 2 weeks (around 07/23/2016).  Willodean Rosenthalarolyn Harraway-Smith, MD

## 2016-07-22 ENCOUNTER — Encounter (HOSPITAL_BASED_OUTPATIENT_CLINIC_OR_DEPARTMENT_OTHER): Payer: Self-pay | Admitting: Emergency Medicine

## 2016-07-22 ENCOUNTER — Emergency Department (HOSPITAL_BASED_OUTPATIENT_CLINIC_OR_DEPARTMENT_OTHER)
Admission: EM | Admit: 2016-07-22 | Discharge: 2016-07-22 | Disposition: A | Discharge status: Home / Self Care | Payer: Medicaid Other | Attending: Emergency Medicine | Admitting: Emergency Medicine

## 2016-07-22 DIAGNOSIS — Y929 Unspecified place or not applicable: Secondary | ICD-10-CM | POA: Diagnosis not present

## 2016-07-22 DIAGNOSIS — Y939 Activity, unspecified: Secondary | ICD-10-CM | POA: Insufficient documentation

## 2016-07-22 DIAGNOSIS — Z23 Encounter for immunization: Secondary | ICD-10-CM | POA: Insufficient documentation

## 2016-07-22 DIAGNOSIS — Z87891 Personal history of nicotine dependence: Secondary | ICD-10-CM | POA: Diagnosis not present

## 2016-07-22 DIAGNOSIS — W540XXA Bitten by dog, initial encounter: Secondary | ICD-10-CM | POA: Insufficient documentation

## 2016-07-22 DIAGNOSIS — S91331A Puncture wound without foreign body, right foot, initial encounter: Secondary | ICD-10-CM | POA: Diagnosis not present

## 2016-07-22 DIAGNOSIS — Z2914 Encounter for prophylactic rabies immune globin: Secondary | ICD-10-CM

## 2016-07-22 DIAGNOSIS — Z79899 Other long term (current) drug therapy: Secondary | ICD-10-CM | POA: Diagnosis not present

## 2016-07-22 DIAGNOSIS — Y999 Unspecified external cause status: Secondary | ICD-10-CM | POA: Insufficient documentation

## 2016-07-22 DIAGNOSIS — S91351A Open bite, right foot, initial encounter: Secondary | ICD-10-CM | POA: Diagnosis present

## 2016-07-22 MED ORDER — TETANUS-DIPHTH-ACELL PERTUSSIS 5-2.5-18.5 LF-MCG/0.5 IM SUSP
0.5000 mL | Freq: Once | INTRAMUSCULAR | Status: AC
  Administered 2016-07-22: 0.5 mL via INTRAMUSCULAR
  Filled 2016-07-22: qty 0.5

## 2016-07-22 MED ORDER — RABIES VACCINE, PCEC IM SUSR
1.0000 mL | Freq: Once | INTRAMUSCULAR | Status: AC
  Administered 2016-07-22: 1 mL via INTRAMUSCULAR
  Filled 2016-07-22: qty 1

## 2016-07-22 MED ORDER — AMOXICILLIN-POT CLAVULANATE 875-125 MG PO TABS: 1.0000 | ORAL_TABLET | Freq: Two times a day (BID) | ORAL | 0 refills | Status: DC

## 2016-07-22 MED ORDER — RABIES IMMUNE GLOBULIN 150 UNIT/ML IM INJ
20.0000 [IU]/kg | INJECTION | Freq: Once | INTRAMUSCULAR | Status: AC
  Administered 2016-07-22: 1650 [IU]
  Filled 2016-07-22: qty 12
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
## 2016-07-22 NOTE — ED Triage Notes (Signed)
Patient reports she was bit by an unknown dog today on her right foot. Patient is 5.5 months pregnant

## 2016-07-24 ENCOUNTER — Encounter: Payer: Medicaid Other | Admitting: Obstetrics and Gynecology

## 2016-07-30 ENCOUNTER — Encounter: Payer: Medicaid Other | Admitting: Obstetrics and Gynecology

## 2016-08-02 ENCOUNTER — Encounter (HOSPITAL_COMMUNITY): Payer: Self-pay | Admitting: *Deleted

## 2016-08-02 ENCOUNTER — Ambulatory Visit (INDEPENDENT_AMBULATORY_CARE_PROVIDER_SITE_OTHER): Payer: Medicaid Other | Admitting: Obstetrics and Gynecology

## 2016-08-02 VITALS — BP 106/63 | HR 76 | Temp 98.2°F | Wt 183.4 lb

## 2016-08-02 DIAGNOSIS — O34219 Maternal care for unspecified type scar from previous cesarean delivery: Secondary | ICD-10-CM

## 2016-08-02 DIAGNOSIS — Z3482 Encounter for supervision of other normal pregnancy, second trimester: Secondary | ICD-10-CM

## 2016-08-02 DIAGNOSIS — O99333 Smoking (tobacco) complicating pregnancy, third trimester: Secondary | ICD-10-CM

## 2016-08-02 DIAGNOSIS — R87612 Low grade squamous intraepithelial lesion on cytologic smear of cervix (LGSIL): Secondary | ICD-10-CM

## 2016-08-02 NOTE — Progress Notes (Signed)
Patient is in the office states she is feeling good, good fetal movement.

## 2016-08-02 NOTE — Progress Notes (Signed)
   PRENATAL VISIT NOTE  Subjective:  Julia [name] is a 35 y.o. [id number] at 2643w1d being seen today for ongoing prenatal care.  She is currently monitored for the following issues for this low-risk pregnancy and has Supervision of normal pregnancy; Previous cesarean delivery x 2; Pap smear abnormality of cervix with LGSIL; and Tobacco use complicating pregnancy on her problem list.  Patient reports no complaints.  Contractions: Not present. Vag. Bleeding: None.  Movement: Present. Denies leaking of fluid.   The following portions of the patient's history were reviewed and updated as appropriate: allergies, current medications, past family history, past medical history, past social history, past surgical history and problem list. Problem list updated.  Objective:   Vitals:   08/02/16 0926  BP: 106/63  Pulse: 76  Temp: 98.2 F (36.8 C)  Weight: 183 lb 6.4 oz (83.2 kg)    Fetal Status: Fetal Heart Rate (bpm): 151 Fundal Height: 28 cm Movement: Present     General:  Alert, oriented and cooperative. Patient is in no acute distress.  Skin: Skin is warm and dry. No rash noted.   Cardiovascular: Normal heart rate noted  Respiratory: Normal respiratory effort, no problems with respiration noted  Abdomen: Soft, gravid, appropriate for gestational age. Pain/Pressure: Absent     Pelvic:  Cervical exam deferred        Extremities: Normal range of motion.  Edema: None  Mental Status: Normal mood and affect. Normal behavior. Normal judgment and thought content.   Urinalysis:      Assessment and Plan:  Pregnancy: [id number] at 7243w1d  1. Encounter for supervision of other normal pregnancy in second trimester Patient is doing well without complaints She was recently bitten by her mother's dog who received his rabbies vaccine. She went to the ED where she received a series of injections including TDAP She declines flu vaccine Patient will return prior to her next appointment for third trimester labs  and glucola  2. Previous cesarean delivery x 2 Will be scheduled for repeat at 39 weeks  3. Pap smear abnormality of cervix with LGSIL colpo postpartum  4. Pregnancy complicated by tobacco use in third trimester   Preterm labor symptoms and general obstetric precautions including but not limited to vaginal bleeding, contractions, leaking of fluid and fetal movement were reviewed in detail with the patient. Please refer to After Visit Summary for other counseling recommendations.  Return in about 2 weeks (around 08/16/2016).  Catalina AntiguaPeggy Brittney Mucha, MD

## 2016-08-13 ENCOUNTER — Other Ambulatory Visit: Payer: Medicaid Other

## 2016-08-13 DIAGNOSIS — Z3480 Encounter for supervision of other normal pregnancy, unspecified trimester: Secondary | ICD-10-CM

## 2016-08-14 LAB — CBC
HEMOGLOBIN: 9.9 g/dL — AB (ref 11.1–15.9)
Hematocrit: 30.7 % — ABNORMAL LOW (ref 34.0–46.6)
MCH: 28.1 pg (ref 26.6–33.0)
MCHC: 32.2 g/dL (ref 31.5–35.7)
MCV: 87 fL (ref 79–97)
Platelets: 269 10*3/uL (ref 150–379)
RBC: 3.52 x10E6/uL — ABNORMAL LOW (ref 3.77–5.28)
RDW: 14 % (ref 12.3–15.4)
WBC: 4.9 10*3/uL (ref 3.4–10.8)

## 2016-08-14 LAB — GLUCOSE TOLERANCE, 2 HOURS W/ 1HR
GLUCOSE, 1 HOUR: 86 mg/dL (ref 65–179)
GLUCOSE, 2 HOUR: 66 mg/dL (ref 65–152)
Glucose, Fasting: 84 mg/dL (ref 65–91)

## 2016-08-14 LAB — HIV ANTIBODY (ROUTINE TESTING W REFLEX): HIV SCREEN 4TH GENERATION: NONREACTIVE

## 2016-08-14 LAB — RPR: RPR: NONREACTIVE

## 2016-08-20 ENCOUNTER — Encounter: Payer: Medicaid Other | Admitting: Obstetrics and Gynecology

## 2016-08-30 ENCOUNTER — Ambulatory Visit (INDEPENDENT_AMBULATORY_CARE_PROVIDER_SITE_OTHER): Payer: Medicaid Other | Admitting: Obstetrics & Gynecology

## 2016-08-30 DIAGNOSIS — Z3482 Encounter for supervision of other normal pregnancy, second trimester: Secondary | ICD-10-CM

## 2016-08-30 DIAGNOSIS — Z3483 Encounter for supervision of other normal pregnancy, third trimester: Secondary | ICD-10-CM

## 2016-08-30 DIAGNOSIS — O34219 Maternal care for unspecified type scar from previous cesarean delivery: Secondary | ICD-10-CM

## 2016-08-30 NOTE — Patient Instructions (Signed)
Return to clinic for any scheduled appointments or obstetric concerns, or go to MAU for evaluation  

## 2016-08-30 NOTE — Progress Notes (Signed)
   PRENATAL VISIT NOTE  Subjective:  Julia [name] is a 35 y.o. [id number] at 2441w1d being seen today for ongoing prenatal care.  She is currently monitored for the following issues for this low-risk pregnancy and has Supervision of normal pregnancy; Previous cesarean delivery x 2; Pap smear abnormality of cervix with LGSIL; and Tobacco use complicating pregnancy on her problem list.  Patient reports no complaints.  Contractions: Not present. Vag. Bleeding: None.  Movement: Present. Denies leaking of fluid.   The following portions of the patient's history were reviewed and updated as appropriate: allergies, current medications, past family history, past medical history, past social history, past surgical history and problem list. Problem list updated.  Objective:   Vitals:   08/30/16 0948  BP: 111/74  Pulse: 85  Temp: 97.7 F (36.5 C)  Weight: 190 lb 3.2 oz (86.3 kg)    Fetal Status: Fetal Heart Rate (bpm): 146 Fundal Height: 32 cm Movement: Present     General:  Alert, oriented and cooperative. Patient is in no acute distress.  Skin: Skin is warm and dry. No rash noted.   Cardiovascular: Normal heart rate noted  Respiratory: Normal respiratory effort, no problems with respiration noted  Abdomen: Soft, gravid, appropriate for gestational age. Pain/Pressure: Absent     Pelvic:  Cervical exam deferred        Extremities: Normal range of motion.  Edema: None  Mental Status: Normal mood and affect. Normal behavior. Normal judgment and thought content.   Assessment and Plan:  Pregnancy: [id number] at 5941w1d  1. Previous cesarean delivery x 2 Already scheduled at 39 weeks.  2. Encounter for supervision of other normal pregnancy in second trimester Preterm labor symptoms and general obstetric precautions including but not limited to vaginal bleeding, contractions, leaking of fluid and fetal movement were reviewed in detail with the patient. Please refer to After Visit Summary for other  counseling recommendations.  Return in about 2 weeks (around 09/13/2016) for OB Visit.   Tereso NewcomerUgonna A Aiyanna Awtrey, MD

## 2016-09-04 ENCOUNTER — Encounter (HOSPITAL_COMMUNITY): Payer: Self-pay

## 2016-09-04 ENCOUNTER — Inpatient Hospital Stay (HOSPITAL_COMMUNITY)
Admission: AD | Admit: 2016-09-04 | Discharge: 2016-09-04 | Disposition: A | Discharge status: Home / Self Care | Payer: Medicaid Other | Source: Ambulatory Visit | Attending: Obstetrics and Gynecology | Admitting: Obstetrics and Gynecology

## 2016-09-04 DIAGNOSIS — O34219 Maternal care for unspecified type scar from previous cesarean delivery: Secondary | ICD-10-CM | POA: Diagnosis not present

## 2016-09-04 DIAGNOSIS — A5901 Trichomonal vulvovaginitis: Secondary | ICD-10-CM | POA: Insufficient documentation

## 2016-09-04 DIAGNOSIS — O23593 Infection of other part of genital tract in pregnancy, third trimester: Secondary | ICD-10-CM

## 2016-09-04 DIAGNOSIS — O4693 Antepartum hemorrhage, unspecified, third trimester: Secondary | ICD-10-CM | POA: Insufficient documentation

## 2016-09-04 DIAGNOSIS — Z87891 Personal history of nicotine dependence: Secondary | ICD-10-CM | POA: Insufficient documentation

## 2016-09-04 DIAGNOSIS — O98313 Other infections with a predominantly sexual mode of transmission complicating pregnancy, third trimester: Secondary | ICD-10-CM | POA: Diagnosis not present

## 2016-09-04 DIAGNOSIS — O99333 Smoking (tobacco) complicating pregnancy, third trimester: Secondary | ICD-10-CM

## 2016-09-04 DIAGNOSIS — Z3A31 31 weeks gestation of pregnancy: Secondary | ICD-10-CM | POA: Diagnosis not present

## 2016-09-04 LAB — WET PREP, GENITAL
CLUE CELLS WET PREP: NONE SEEN
Sperm: NONE SEEN
TRICH WET PREP: NONE SEEN
YEAST WET PREP: NONE SEEN

## 2016-09-04 LAB — URINALYSIS, ROUTINE W REFLEX MICROSCOPIC
GLUCOSE, UA: NEGATIVE mg/dL
Ketones, ur: NEGATIVE mg/dL
Nitrite: NEGATIVE
PH: 7 (ref 5.0–8.0)
PROTEIN: NEGATIVE mg/dL
Specific Gravity, Urine: 1.02 (ref 1.005–1.030)

## 2016-09-04 LAB — URINE MICROSCOPIC-ADD ON

## 2016-09-04 MED ORDER — METRONIDAZOLE 500 MG PO TABS
2000.0000 mg | ORAL_TABLET | Freq: Once | ORAL | Status: AC
  Administered 2016-09-04: 2000 mg via ORAL
  Filled 2016-09-04: qty 4
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
## 2016-09-04 NOTE — MAU Note (Signed)
Pt c/o vaginal spotting starting last night. Pt denies leaking. Pt states baby is moving normally. Pt states she has a lot of vaginal pain and pressure. Pt denies contractions.

## 2016-09-04 NOTE — Discharge Instructions (Signed)
Sexually Transmitted Disease A sexually transmitted disease (STD) is a disease or infection often passed to another person during sex. However, STDs can be passed through nonsexual ways. An STD can be passed through:  Spit (saliva).  Semen.  Blood.  Mucus from the vagina.  Pee (urine). How can I lessen my chances of getting an STD?  Use:  Latex condoms.  Water-soluble lubricants with condoms. Do not use petroleum jelly or oils.  Dental dams. These are small pieces of latex that are used as a barrier during oral sex.  Avoid having more than one sex partner.  Do not have sex with someone who has other sex partners.  Do not have sex with anyone you do not know or who is at high risk for an STD.  Avoid risky sex that can break your skin.  Do not have sex if you have open sores on your mouth or skin.  Avoid drinking too much alcohol or taking illegal drugs. Alcohol and drugs can affect your good judgment.  Avoid oral and anal sex acts.  Get shots (vaccines) for HPV and hepatitis.  If you are at risk of being infected with HIV, it is advised that you take a certain medicine daily to prevent HIV infection. This is called pre-exposure prophylaxis (PrEP). You may be at risk if:  You are a man who has sex with other men (MSM).  You are attracted to the opposite sex (heterosexual) and are having sex with more than one partner.  You take drugs with a needle.  You have sex with someone who has HIV.  Talk with your doctor about if you are at high risk of being infected with HIV. If you begin to take PrEP, get tested for HIV first. Get tested every 3 months for as long as you are taking PrEP.  Get tested for STDs every year if you are sexually active. If you are treated for an STD, get tested again 3 months after you are treated. What should I do if I think I have an STD?  See your doctor.  Tell your sex partner(s) that you have an STD. They should be tested and treated.  Do  not have sex until your doctor says it is okay. When should I get help? Get help right away if:  You have bad belly (abdominal) pain.  You are a man and have puffiness (swelling) or pain in your testicles.  You are a woman and have puffiness in your vagina. This information is not intended to replace advice given to you by your health care provider. Make sure you discuss any questions you have with your health care provider. Document Released: 11/15/2004 Document Revised: 03/15/2016 Document Reviewed: 04/03/2013 Elsevier Interactive Patient Education  2017 [employer]  Trichomoniasis Trichomoniasis is an infection caused by an organism called Trichomonas. The infection can affect both women and men. In women, the outer female genitalia and the vagina are affected. In men, the penis is mainly affected, but the prostate and other reproductive organs can also be involved. Trichomoniasis is a sexually transmitted infection (STI) and is most often passed to another person through sexual contact.  RISK FACTORS  Having unprotected sexual intercourse.  Having sexual intercourse with an infected partner. SIGNS AND SYMPTOMS  Symptoms of trichomoniasis in women include:  Abnormal gray-green frothy vaginal discharge.  Itching and irritation of the vagina.  Itching and irritation of the area outside the vagina. Symptoms of trichomoniasis in men include:   Penile  discharge with or without pain.  Pain during urination. This results from inflammation of the urethra. DIAGNOSIS  Trichomoniasis may be found during a Pap test or physical exam. Your health care provider may use one of the following methods to help diagnose this infection:  Testing the pH of the vagina with a test tape.  Using a vaginal swab test that checks for the Trichomonas organism. A test is available that provides results within a few minutes.  Examining a urine sample.  Testing vaginal secretions. Your health care  provider may test you for other STIs, including HIV. TREATMENT   You may be given medicine to fight the infection. Women should inform their health care provider if they could be or are pregnant. Some medicines used to treat the infection should not be taken during pregnancy.  Your health care provider may recommend over-the-counter medicines or creams to decrease itching or irritation.  Your sexual partner will need to be treated if infected.  Your health care provider may test you for infection again 3 months after treatment. HOME CARE INSTRUCTIONS   Take medicines only as directed by your health care provider.  Take over-the-counter medicine for itching or irritation as directed by your health care provider.  Do not have sexual intercourse while you have the infection.  Women should not douche or wear tampons while they have the infection.  Discuss your infection with your partner. Your partner may have gotten the infection from you, or you may have gotten it from your partner.  Have your sex partner get examined and treated if necessary.  Practice safe, informed, and protected sex.  See your health care provider for other STI testing. SEEK MEDICAL CARE IF:   You still have symptoms after you finish your medicine.  You develop abdominal pain.  You have pain when you urinate.  You have bleeding after sexual intercourse.  You develop a rash.  Your medicine makes you sick or makes you throw up (vomit). MAKE SURE YOU:  Understand these instructions.  Will watch your condition.  Will get help right away if you are not doing well or get worse. This information is not intended to replace advice given to you by your health care provider. Make sure you discuss any questions you have with your health care provider. Document Released: 04/03/2001 Document Revised: 10/29/2014 Document Reviewed: 07/20/2013 Elsevier Interactive Patient Education  2017 [employer]

## 2016-09-06 ENCOUNTER — Encounter: Payer: Self-pay | Admitting: Obstetrics and Gynecology

## 2016-09-06 DIAGNOSIS — Z8619 Personal history of other infectious and parasitic diseases: Secondary | ICD-10-CM | POA: Insufficient documentation

## 2016-09-06 LAB — GC/CHLAMYDIA PROBE AMP (~~LOC~~) NOT AT ARMC
Chlamydia: NEGATIVE
NEISSERIA GONORRHEA: NEGATIVE

## 2016-09-10 ENCOUNTER — Encounter: Payer: Medicaid Other | Admitting: Obstetrics and Gynecology

## 2016-09-17 ENCOUNTER — Ambulatory Visit (INDEPENDENT_AMBULATORY_CARE_PROVIDER_SITE_OTHER): Payer: Medicaid Other | Admitting: Obstetrics

## 2016-09-17 ENCOUNTER — Encounter: Payer: Self-pay | Admitting: Obstetrics

## 2016-09-17 VITALS — BP 103/67 | HR 87 | Wt 189.9 lb

## 2016-09-17 DIAGNOSIS — A5901 Trichomonal vulvovaginitis: Secondary | ICD-10-CM

## 2016-09-17 DIAGNOSIS — Z3483 Encounter for supervision of other normal pregnancy, third trimester: Secondary | ICD-10-CM

## 2016-09-17 MED ORDER — METRONIDAZOLE 500 MG PO TABS: 2000.0000 mg | ORAL_TABLET | Freq: Once | ORAL | 1 refills | Status: AC

## 2016-09-17 NOTE — Progress Notes (Signed)
Subjective:    Julia [name] is a 35 y.o. female being seen today for her obstetrical visit. She is at 2774w5d gestation. Patient reports re-exposure to Trichomonas.  Partner did not get treated. Fetal movement: normal.  Problem List Items Addressed This Visit    Supervision of normal pregnancy     Patient Active Problem List   Diagnosis Date Noted  . History of trichomoniasis 09/06/2016  . Tobacco use complicating pregnancy 07/09/2016  . Supervision of normal pregnancy 05/29/2016  . Previous cesarean delivery x 2 05/29/2016  . Pap smear abnormality of cervix with LGSIL 04/09/2016   Objective:    BP 103/67   Pulse 87   Wt 189 lb 14.4 oz (86.1 kg)   LMP 01/21/2016 (Approximate)   BMI 33.64 kg/m  FHT:  140 BPM  Uterine Size: size equals dates  Presentation: unsure     Assessment:    Pregnancy @ 6074w5d weeks    Trichomonas contact  Plan:   Flagyl Rx for Trichomonas.  Patient states that she went with partner to be treated today.   labs reviewed, problem list updated Consent signed. GBS sent TDAP offered  Rhogam given for RH negative Pediatrician: discussed. Infant feeding: plans to breastfeed. Maternity leave: discussed. Cigarette smoking: former smoker. No orders of the defined types were placed in this encounter.  No orders of the defined types were placed in this encounter.  Follow up in 2 Weeks.

## 2016-10-04 ENCOUNTER — Ambulatory Visit (INDEPENDENT_AMBULATORY_CARE_PROVIDER_SITE_OTHER): Payer: Medicaid Other | Admitting: Obstetrics and Gynecology

## 2016-10-04 ENCOUNTER — Other Ambulatory Visit (HOSPITAL_COMMUNITY)
Admission: RE | Admit: 2016-10-04 | Discharge: 2016-10-04 | Disposition: A | Discharge status: Home / Self Care | Payer: Medicaid Other | Source: Ambulatory Visit | Attending: Obstetrics and Gynecology | Admitting: Obstetrics and Gynecology

## 2016-10-04 VITALS — BP 115/77 | HR 89 | Wt 193.0 lb

## 2016-10-04 DIAGNOSIS — Z113 Encounter for screening for infections with a predominantly sexual mode of transmission: Secondary | ICD-10-CM | POA: Diagnosis not present

## 2016-10-04 DIAGNOSIS — O34219 Maternal care for unspecified type scar from previous cesarean delivery: Secondary | ICD-10-CM

## 2016-10-04 DIAGNOSIS — O99333 Smoking (tobacco) complicating pregnancy, third trimester: Secondary | ICD-10-CM

## 2016-10-04 DIAGNOSIS — Z3483 Encounter for supervision of other normal pregnancy, third trimester: Secondary | ICD-10-CM

## 2016-10-04 MED ORDER — SERTRALINE HCL 50 MG PO TABS: 50.0000 mg | ORAL_TABLET | Freq: Every day | ORAL | 3 refills | Status: DC

## 2016-10-04 NOTE — Progress Notes (Signed)
Patient needs Rx sent for Zoloft

## 2016-10-04 NOTE — Progress Notes (Addendum)
   PRENATAL VISIT NOTE  Subjective:  Julia AmorSelina [name] is a 35 y.o. [id number] at 5796w1d being seen today for ongoing prenatal care.  She is currently monitored for the following issues for this low-risk pregnancy and has Supervision of normal pregnancy; Previous cesarean delivery x 2; Pap smear abnormality of cervix with LGSIL; Tobacco use complicating pregnancy; and History of trichomoniasis on her problem list.  Patient reports no complaints.  Contractions: Irregular. Vag. Bleeding: None.  Movement: Present. Denies leaking of fluid.   The following portions of the patient's history were reviewed and updated as appropriate: allergies, current medications, past family history, past medical history, past social history, past surgical history and problem list. Problem list updated.  Objective:   Vitals:   10/04/16 1624  BP: 115/77  Pulse: 89  Weight: 193 lb (87.5 kg)    Fetal Status: Fetal Heart Rate (bpm): 136 Fundal Height: 36 cm Movement: Present  Presentation: Vertex  General:  Alert, oriented and cooperative. Patient is in no acute distress.  Skin: Skin is warm and dry. No rash noted.   Cardiovascular: Normal heart rate noted  Respiratory: Normal respiratory effort, no problems with respiration noted  Abdomen: Soft, gravid, appropriate for gestational age. Pain/Pressure: Absent     Pelvic:  Cervical exam performed Dilation: 1 Effacement (%): 50 Station: Ballotable  Extremities: Normal range of motion.  Edema: None  Mental Status: Normal mood and affect. Normal behavior. Normal judgment and thought content.   Assessment and Plan:  Pregnancy: [id number] at 6196w1d  1. Encounter for supervision of other normal pregnancy in third trimester Patient is doing well Cultures today TOC for trich today Patient is considering LARC  - Culture, beta strep (group b only) - GC/Chlamydia probe amp ([place] - NuSwab VG, Candida 6sp  2. Previous cesarean delivery x 2 Already scheduled  for repeat  3. Pregnancy complicated by tobacco use in third trimester Smoking cessation discussed  Preterm labor symptoms and general obstetric precautions including but not limited to vaginal bleeding, contractions, leaking of fluid and fetal movement were reviewed in detail with the patient. Please refer to After Visit Summary for other counseling recommendations.  Return in about 1 week (around 10/11/2016).   Catalina AntiguaPeggy Hadiya Spoerl, MD

## 2016-10-05 LAB — GC/CHLAMYDIA PROBE AMP (~~LOC~~) NOT AT ARMC
CHLAMYDIA, DNA PROBE: NEGATIVE
NEISSERIA GONORRHEA: NEGATIVE

## 2016-10-08 LAB — CULTURE, BETA STREP (GROUP B ONLY): STREP GP B CULTURE: NEGATIVE

## 2016-10-09 LAB — NUSWAB VG, CANDIDA 6SP
Atopobium vaginae: HIGH Score — AB
BVAB 2: HIGH {score} — AB
CANDIDA ALBICANS, NAA: NEGATIVE
CANDIDA PARAPSILOSIS, NAA: NEGATIVE
CANDIDA TROPICALIS, NAA: NEGATIVE
Candida glabrata, NAA: NEGATIVE
Candida krusei, NAA: NEGATIVE
Candida lusitaniae, NAA: NEGATIVE
MEGASPHAERA 1: HIGH {score} — AB
Trich vag by NAA: NEGATIVE

## 2016-10-11 ENCOUNTER — Ambulatory Visit (INDEPENDENT_AMBULATORY_CARE_PROVIDER_SITE_OTHER): Payer: Medicaid Other | Admitting: Obstetrics and Gynecology

## 2016-10-11 VITALS — BP 115/76 | HR 75 | Wt 193.0 lb

## 2016-10-11 DIAGNOSIS — Z3483 Encounter for supervision of other normal pregnancy, third trimester: Secondary | ICD-10-CM

## 2016-10-11 DIAGNOSIS — O34219 Maternal care for unspecified type scar from previous cesarean delivery: Secondary | ICD-10-CM

## 2016-10-11 NOTE — Progress Notes (Signed)
Subjective:  Julia [name] is a 35 y.o. [id number] at 645w1d being seen today for ongoing prenatal care.  She is currently monitored for the following issues for this low-risk pregnancy and has Supervision of normal pregnancy; Previous cesarean delivery x 2; Pap smear abnormality of cervix with LGSIL; Tobacco use complicating pregnancy; and History of trichomoniasis on her problem list.  Patient reports no complaints.  Contractions: Irregular. Vag. Bleeding: None.   . Denies leaking of fluid.   The following portions of the patient's history were reviewed and updated as appropriate: allergies, current medications, past family history, past medical history, past social history, past surgical history and problem list. Problem list updated.  Objective:   Vitals:   10/11/16 1042  BP: 115/76  Pulse: 75  Weight: 193 lb (87.5 kg)    Fetal Status:           General:  Alert, oriented and cooperative. Patient is in no acute distress.  Skin: Skin is warm and dry. No rash noted.   Cardiovascular: Normal heart rate noted  Respiratory: Normal respiratory effort, no problems with respiration noted  Abdomen: Soft, gravid, appropriate for gestational age. Pain/Pressure: Present     Pelvic:  Cervical exam deferred        Extremities: Normal range of motion.  Edema: None  Mental Status: Normal mood and affect. Normal behavior. Normal judgment and thought content.   Urinalysis:      Assessment and Plan:  Pregnancy: [id number] at 1545w1d  1. Encounter for supervision of other normal pregnancy in third trimester   2. Previous cesarean delivery x 2 [facility]  Term labor symptoms and general obstetric precautions including but not limited to vaginal bleeding, contractions, leaking of fluid and fetal movement were reviewed in detail with the patient. Please refer to After Visit Summary for other counseling recommendations.  Return in about 1 week (around 10/18/2016).   Hermina StaggersMichael L  Qadir Folks, MD

## 2016-10-12 ENCOUNTER — Other Ambulatory Visit: Payer: Self-pay | Admitting: Obstetrics and Gynecology

## 2016-10-12 MED ORDER — METRONIDAZOLE 500 MG PO TABS: 500.0000 mg | ORAL_TABLET | Freq: Two times a day (BID) | ORAL | 0 refills | Status: DC

## 2016-10-16 ENCOUNTER — Telehealth: Payer: Self-pay | Admitting: *Deleted

## 2016-10-16 NOTE — Telephone Encounter (Signed)
Telephone call to patient, notified her of BV result and instructed to pick up RX.

## 2016-10-17 ENCOUNTER — Telehealth (HOSPITAL_COMMUNITY): Payer: Self-pay | Admitting: *Deleted

## 2016-10-17 NOTE — Telephone Encounter (Signed)
Preadmission screen  

## 2016-10-19 ENCOUNTER — Encounter (HOSPITAL_COMMUNITY): Payer: Self-pay

## 2016-10-19 ENCOUNTER — Ambulatory Visit (INDEPENDENT_AMBULATORY_CARE_PROVIDER_SITE_OTHER): Payer: Medicaid Other | Admitting: Obstetrics and Gynecology

## 2016-10-19 VITALS — BP 125/81 | HR 80 | Wt 194.2 lb

## 2016-10-19 DIAGNOSIS — Z3483 Encounter for supervision of other normal pregnancy, third trimester: Secondary | ICD-10-CM

## 2016-10-19 NOTE — Progress Notes (Signed)
Prenatal Visit Note Date: 10/19/2016 Clinic: [facility]  Subjective:  Julia [name] is a 35 y.o. [id number] at 4055w2d being seen today for ongoing prenatal care.  She is currently monitored for the following issues for this low-risk pregnancy and has Supervision of normal pregnancy; Previous cesarean delivery x 2; Pap smear abnormality of cervix with LGSIL; Tobacco use complicating pregnancy; and History of trichomoniasis on her problem list.  Patient reports no complaints.   Contractions: Irregular. Vag. Bleeding: None.  Movement: Present. Denies leaking of fluid.   The following portions of the patient's history were reviewed and updated as appropriate: allergies, current medications, past family history, past medical history, past social history, past surgical history and problem list. Problem list updated.  Objective:   Vitals:   10/19/16 1033  BP: 125/81  Pulse: 80  Weight: 194 lb 3.2 oz (88.1 kg)    Fetal Status: Fetal Heart Rate (bpm): 140s Fundal Height: 38 cm Movement: Present  Presentation: Vertex  General:  Alert, oriented and cooperative. Patient is in no acute distress.  Skin: Skin is warm and dry. No rash noted.   Cardiovascular: Normal heart rate noted  Respiratory: Normal respiratory effort, no problems with respiration noted  Abdomen: Soft, gravid, appropriate for gestational age. Pain/Pressure: Absent     Pelvic:  Cervical exam deferred        Extremities: Normal range of motion.     Mental Status: Normal mood and affect. Normal behavior. Normal judgment and thought content.   Urinalysis:      Assessment and Plan:  Pregnancy: [id number] at 4455w2d Routine care. Liletta. Rpt already scheduled for next week.   Term labor symptoms and general obstetric precautions including but not limited to vaginal bleeding, contractions, leaking of fluid and fetal movement were reviewed in detail with the patient. Please refer to After Visit Summary for other  counseling recommendations.  Return in about 5 weeks (around 11/23/2016) for PP visit.   Leon Bingharlie Myesha Stillion, MD

## 2016-10-23 ENCOUNTER — Encounter (HOSPITAL_COMMUNITY)
Admission: RE | Admit: 2016-10-23 | Discharge: 2016-10-23 | Disposition: A | Discharge status: Home / Self Care | Payer: Medicaid Other | Source: Ambulatory Visit | Attending: Family Medicine | Admitting: Family Medicine

## 2016-10-23 LAB — CBC
HCT: 26.2 % — ABNORMAL LOW (ref 36.0–46.0)
Hemoglobin: 9.3 g/dL — ABNORMAL LOW (ref 12.0–15.0)
MCH: 29.9 pg (ref 26.0–34.0)
MCHC: 35.5 g/dL (ref 30.0–36.0)
MCV: 84.2 fL (ref 78.0–100.0)
PLATELETS: 229 10*3/uL (ref 150–400)
RBC: 3.11 MIL/uL — ABNORMAL LOW (ref 3.87–5.11)
RDW: 14.1 % (ref 11.5–15.5)
WBC: 7 10*3/uL (ref 4.0–10.5)

## 2016-10-23 LAB — TYPE AND SCREEN
ABO/RH(D): O POS
Antibody Screen: NEGATIVE

## 2016-10-24 ENCOUNTER — Inpatient Hospital Stay (HOSPITAL_COMMUNITY)
Admission: RE | Admit: 2016-10-24 | Discharge: 2016-10-27 | DRG: 765 | Disposition: A | Discharge status: Home / Self Care | Payer: Medicaid Other | Source: Ambulatory Visit | Attending: Family Medicine | Admitting: Family Medicine

## 2016-10-24 ENCOUNTER — Encounter (HOSPITAL_COMMUNITY)
Admission: RE | Disposition: A | Discharge status: Home / Self Care | Payer: Self-pay | Source: Ambulatory Visit | Attending: Family Medicine

## 2016-10-24 ENCOUNTER — Encounter (HOSPITAL_COMMUNITY): Payer: Self-pay | Admitting: Family Medicine

## 2016-10-24 ENCOUNTER — Inpatient Hospital Stay (HOSPITAL_COMMUNITY): Payer: Medicaid Other | Admitting: Anesthesiology

## 2016-10-24 DIAGNOSIS — O9081 Anemia of the puerperium: Secondary | ICD-10-CM | POA: Diagnosis not present

## 2016-10-24 DIAGNOSIS — D62 Acute posthemorrhagic anemia: Secondary | ICD-10-CM | POA: Diagnosis not present

## 2016-10-24 DIAGNOSIS — O34211 Maternal care for low transverse scar from previous cesarean delivery: Secondary | ICD-10-CM | POA: Diagnosis present

## 2016-10-24 DIAGNOSIS — Z87891 Personal history of nicotine dependence: Secondary | ICD-10-CM | POA: Diagnosis not present

## 2016-10-24 DIAGNOSIS — Z3490 Encounter for supervision of normal pregnancy, unspecified, unspecified trimester: Secondary | ICD-10-CM

## 2016-10-24 DIAGNOSIS — Z3A39 39 weeks gestation of pregnancy: Secondary | ICD-10-CM

## 2016-10-24 DIAGNOSIS — O99330 Smoking (tobacco) complicating pregnancy, unspecified trimester: Secondary | ICD-10-CM | POA: Diagnosis present

## 2016-10-24 DIAGNOSIS — R87612 Low grade squamous intraepithelial lesion on cytologic smear of cervix (LGSIL): Secondary | ICD-10-CM | POA: Diagnosis present

## 2016-10-24 DIAGNOSIS — Z98891 History of uterine scar from previous surgery: Secondary | ICD-10-CM

## 2016-10-24 DIAGNOSIS — O34219 Maternal care for unspecified type scar from previous cesarean delivery: Secondary | ICD-10-CM | POA: Diagnosis present

## 2016-10-24 LAB — RPR: RPR: NONREACTIVE

## 2016-10-24 SURGERY — Surgical Case
Anesthesia: Spinal | Site: Abdomen

## 2016-10-24 MED ORDER — LACTATED RINGERS IV SOLN
INTRAVENOUS | Status: DC | PRN
  Administered 2016-10-24 (×3): via INTRAVENOUS

## 2016-10-24 MED ORDER — SODIUM CHLORIDE 0.9 % IR SOLN
Status: DC | PRN
  Administered 2016-10-24: 1

## 2016-10-24 MED ORDER — DIPHENHYDRAMINE HCL 50 MG/ML IJ SOLN: 12.5000 mg | INTRAMUSCULAR | Status: DC | PRN

## 2016-10-24 MED ORDER — LACTATED RINGERS IV SOLN
INTRAVENOUS | Status: DC
  Administered 2016-10-24: 11:00:00 via INTRAVENOUS

## 2016-10-24 MED ORDER — SODIUM CHLORIDE 0.9% FLUSH: 3.0000 mL | INTRAVENOUS | Status: DC | PRN

## 2016-10-24 MED ORDER — NALBUPHINE HCL 10 MG/ML IJ SOLN: 5.0000 mg | INTRAMUSCULAR | Status: DC | PRN

## 2016-10-24 MED ORDER — OXYCODONE HCL 5 MG PO TABS
5.0000 mg | ORAL_TABLET | ORAL | Status: DC | PRN
  Administered 2016-10-25 – 2016-10-27 (×7): 5 mg via ORAL
  Filled 2016-10-24 (×7): qty 1

## 2016-10-24 MED ORDER — DIPHENHYDRAMINE HCL 25 MG PO CAPS: 25.0000 mg | ORAL_CAPSULE | ORAL | Status: DC | PRN

## 2016-10-24 MED ORDER — IBUPROFEN 600 MG PO TABS
600.0000 mg | ORAL_TABLET | Freq: Four times a day (QID) | ORAL | Status: DC
  Administered 2016-10-24 – 2016-10-27 (×10): 600 mg via ORAL
  Filled 2016-10-24 (×11): qty 1

## 2016-10-24 MED ORDER — OXYTOCIN 10 UNIT/ML IJ SOLN
INTRAVENOUS | Status: DC | PRN
  Administered 2016-10-24: 40 [IU] via INTRAVENOUS

## 2016-10-24 MED ORDER — PHENYLEPHRINE 8 MG IN D5W 100 ML (0.08MG/ML) PREMIX OPTIME
INJECTION | INTRAVENOUS | Status: AC
  Filled 2016-10-24: qty 100

## 2016-10-24 MED ORDER — NALBUPHINE HCL 10 MG/ML IJ SOLN
INTRAMUSCULAR | Status: AC
  Filled 2016-10-24: qty 1

## 2016-10-24 MED ORDER — ONDANSETRON HCL 4 MG/2ML IJ SOLN: 4.0000 mg | Freq: Three times a day (TID) | INTRAMUSCULAR | Status: DC | PRN

## 2016-10-24 MED ORDER — SERTRALINE HCL 50 MG PO TABS
50.0000 mg | ORAL_TABLET | Freq: Every day | ORAL | Status: DC
  Administered 2016-10-25 – 2016-10-26 (×2): 50 mg via ORAL
  Filled 2016-10-24 (×4): qty 1

## 2016-10-24 MED ORDER — KETOROLAC TROMETHAMINE 30 MG/ML IJ SOLN
30.0000 mg | Freq: Four times a day (QID) | INTRAMUSCULAR | Status: AC | PRN
  Administered 2016-10-24: 30 mg via INTRAMUSCULAR

## 2016-10-24 MED ORDER — BUPIVACAINE IN DEXTROSE 0.75-8.25 % IT SOLN
INTRATHECAL | Status: DC | PRN
  Administered 2016-10-24: 1.6 mg via INTRATHECAL

## 2016-10-24 MED ORDER — SCOPOLAMINE 1 MG/3DAYS TD PT72
1.0000 | MEDICATED_PATCH | Freq: Once | TRANSDERMAL | Status: AC
  Administered 2016-10-24: 1.5 mg via TRANSDERMAL

## 2016-10-24 MED ORDER — NALOXONE HCL 0.4 MG/ML IJ SOLN: 0.4000 mg | INTRAMUSCULAR | Status: DC | PRN

## 2016-10-24 MED ORDER — ONDANSETRON HCL 4 MG/2ML IJ SOLN
INTRAMUSCULAR | Status: AC
  Filled 2016-10-24: qty 2

## 2016-10-24 MED ORDER — FENTANYL CITRATE (PF) 100 MCG/2ML IJ SOLN
INTRAMUSCULAR | Status: DC | PRN
  Administered 2016-10-24: 50 ug via INTRAVENOUS
  Administered 2016-10-24: 90 ug via INTRAVENOUS
  Administered 2016-10-24: 10 ug via INTRATHECAL

## 2016-10-24 MED ORDER — LACTATED RINGERS IV SOLN
INTRAVENOUS | Status: DC
  Administered 2016-10-24: 18:00:00 via INTRAVENOUS

## 2016-10-24 MED ORDER — TETANUS-DIPHTH-ACELL PERTUSSIS 5-2.5-18.5 LF-MCG/0.5 IM SUSP: 0.5000 mL | Freq: Once | INTRAMUSCULAR | Status: DC

## 2016-10-24 MED ORDER — DEXAMETHASONE SODIUM PHOSPHATE 4 MG/ML IJ SOLN
INTRAMUSCULAR | Status: DC | PRN
  Administered 2016-10-24: 4 mg via INTRAVENOUS

## 2016-10-24 MED ORDER — OXYTOCIN 10 UNIT/ML IJ SOLN
INTRAMUSCULAR | Status: AC
  Filled 2016-10-24: qty 4

## 2016-10-24 MED ORDER — MEPERIDINE HCL 25 MG/ML IJ SOLN: 6.2500 mg | INTRAMUSCULAR | Status: DC | PRN

## 2016-10-24 MED ORDER — SCOPOLAMINE 1 MG/3DAYS TD PT72: 1.0000 | MEDICATED_PATCH | Freq: Once | TRANSDERMAL | Status: DC

## 2016-10-24 MED ORDER — DIPHENHYDRAMINE HCL 25 MG PO CAPS
25.0000 mg | ORAL_CAPSULE | ORAL | Status: DC | PRN
  Filled 2016-10-24: qty 1

## 2016-10-24 MED ORDER — NALOXONE HCL 2 MG/2ML IJ SOSY
1.0000 ug/kg/h | PREFILLED_SYRINGE | INTRAVENOUS | Status: DC | PRN
  Filled 2016-10-24: qty 2

## 2016-10-24 MED ORDER — NALBUPHINE HCL 10 MG/ML IJ SOLN: 5.0000 mg | Freq: Once | INTRAMUSCULAR | Status: DC | PRN

## 2016-10-24 MED ORDER — SIMETHICONE 80 MG PO CHEW
80.0000 mg | CHEWABLE_TABLET | ORAL | Status: DC | PRN
  Administered 2016-10-25: 80 mg via ORAL

## 2016-10-24 MED ORDER — PRENATAL MULTIVITAMIN CH
1.0000 | ORAL_TABLET | Freq: Every day | ORAL | Status: DC
  Administered 2016-10-25 – 2016-10-26 (×2): 1 via ORAL
  Filled 2016-10-24 (×2): qty 1

## 2016-10-24 MED ORDER — SCOPOLAMINE 1 MG/3DAYS TD PT72
MEDICATED_PATCH | TRANSDERMAL | Status: AC
  Administered 2016-10-24: 1.5 mg via TRANSDERMAL
  Filled 2016-10-24: qty 1

## 2016-10-24 MED ORDER — BUPIVACAINE HCL (PF) 0.25 % IJ SOLN
INTRAMUSCULAR | Status: AC
  Filled 2016-10-24: qty 30

## 2016-10-24 MED ORDER — KETOROLAC TROMETHAMINE 30 MG/ML IJ SOLN: 30.0000 mg | Freq: Four times a day (QID) | INTRAMUSCULAR | Status: AC | PRN

## 2016-10-24 MED ORDER — OXYCODONE HCL 5 MG PO TABS
10.0000 mg | ORAL_TABLET | ORAL | Status: DC | PRN
  Administered 2016-10-27: 10 mg via ORAL
  Filled 2016-10-24 (×2): qty 2

## 2016-10-24 MED ORDER — PHENYLEPHRINE HCL 10 MG/ML IJ SOLN
INTRAMUSCULAR | Status: DC | PRN
  Administered 2016-10-24: 40 ug via INTRAVENOUS

## 2016-10-24 MED ORDER — MENTHOL 3 MG MT LOZG: 1.0000 | LOZENGE | OROMUCOSAL | Status: DC | PRN

## 2016-10-24 MED ORDER — ZOLPIDEM TARTRATE 5 MG PO TABS: 5.0000 mg | ORAL_TABLET | Freq: Every evening | ORAL | Status: DC | PRN

## 2016-10-24 MED ORDER — LACTATED RINGERS IV SOLN
Freq: Once | INTRAVENOUS | Status: AC
  Administered 2016-10-24: 1000 mL/h via INTRAVENOUS

## 2016-10-24 MED ORDER — FENTANYL CITRATE (PF) 100 MCG/2ML IJ SOLN
INTRAMUSCULAR | Status: AC
  Filled 2016-10-24: qty 2

## 2016-10-24 MED ORDER — KETOROLAC TROMETHAMINE 30 MG/ML IJ SOLN: 30.0000 mg | Freq: Four times a day (QID) | INTRAMUSCULAR | Status: DC | PRN

## 2016-10-24 MED ORDER — CEFAZOLIN SODIUM-DEXTROSE 2-4 GM/100ML-% IV SOLN
2.0000 g | INTRAVENOUS | Status: AC
  Administered 2016-10-24: 2 g via INTRAVENOUS
  Filled 2016-10-24: qty 100

## 2016-10-24 MED ORDER — KETOROLAC TROMETHAMINE 30 MG/ML IJ SOLN
INTRAMUSCULAR | Status: AC
  Filled 2016-10-24: qty 1

## 2016-10-24 MED ORDER — ACETAMINOPHEN 325 MG PO TABS
650.0000 mg | ORAL_TABLET | ORAL | Status: DC | PRN
  Administered 2016-10-25 – 2016-10-26 (×4): 650 mg via ORAL
  Filled 2016-10-24 (×4): qty 2

## 2016-10-24 MED ORDER — PHENYLEPHRINE 8 MG IN D5W 100 ML (0.08MG/ML) PREMIX OPTIME
INJECTION | INTRAVENOUS | Status: DC | PRN
  Administered 2016-10-24: 60 ug/min via INTRAVENOUS

## 2016-10-24 MED ORDER — COCONUT OIL OIL: 1.0000 "application " | TOPICAL_OIL | Status: DC | PRN

## 2016-10-24 MED ORDER — SIMETHICONE 80 MG PO CHEW
80.0000 mg | CHEWABLE_TABLET | Freq: Three times a day (TID) | ORAL | Status: DC
  Administered 2016-10-24 – 2016-10-26 (×6): 80 mg via ORAL
  Filled 2016-10-24 (×8): qty 1

## 2016-10-24 MED ORDER — ONDANSETRON HCL 4 MG/2ML IJ SOLN
INTRAMUSCULAR | Status: DC | PRN
  Administered 2016-10-24: 4 mg via INTRAVENOUS

## 2016-10-24 MED ORDER — MORPHINE SULFATE-NACL 0.5-0.9 MG/ML-% IV SOSY
PREFILLED_SYRINGE | INTRAVENOUS | Status: AC
  Filled 2016-10-24: qty 1

## 2016-10-24 MED ORDER — OXYTOCIN 40 UNITS IN LACTATED RINGERS INFUSION - SIMPLE MED: 2.5000 [IU]/h | INTRAVENOUS | Status: AC

## 2016-10-24 MED ORDER — SIMETHICONE 80 MG PO CHEW
80.0000 mg | CHEWABLE_TABLET | ORAL | Status: DC
  Administered 2016-10-25 – 2016-10-26 (×3): 80 mg via ORAL
  Filled 2016-10-24 (×3): qty 1

## 2016-10-24 MED ORDER — SENNOSIDES-DOCUSATE SODIUM 8.6-50 MG PO TABS
2.0000 | ORAL_TABLET | ORAL | Status: DC
  Administered 2016-10-25 – 2016-10-26 (×3): 2 via ORAL
  Filled 2016-10-24 (×3): qty 2

## 2016-10-24 MED ORDER — NALBUPHINE HCL 10 MG/ML IJ SOLN
5.0000 mg | INTRAMUSCULAR | Status: DC | PRN
  Administered 2016-10-24: 5 mg via SUBCUTANEOUS

## 2016-10-24 MED ORDER — DIPHENHYDRAMINE HCL 25 MG PO CAPS: 25.0000 mg | ORAL_CAPSULE | Freq: Four times a day (QID) | ORAL | Status: DC | PRN

## 2016-10-24 MED ORDER — BUPIVACAINE HCL 0.25 % IJ SOLN
INTRAMUSCULAR | Status: DC | PRN
  Administered 2016-10-24: 30 mL

## 2016-10-24 MED ORDER — MORPHINE SULFATE (PF) 0.5 MG/ML IJ SOLN
INTRAMUSCULAR | Status: DC | PRN
  Administered 2016-10-24: .2 mg via INTRATHECAL

## 2016-10-24 MED ORDER — DIBUCAINE 1 % RE OINT: 1.0000 "application " | TOPICAL_OINTMENT | RECTAL | Status: DC | PRN

## 2016-10-24 MED ORDER — WITCH HAZEL-GLYCERIN EX PADS: 1.0000 "application " | MEDICATED_PAD | CUTANEOUS | Status: DC | PRN

## 2016-10-24 SURGICAL SUPPLY — 32 items
APL SKNCLS STERI-STRIP NONHPOA (GAUZE/BANDAGES/DRESSINGS) ×1
BENZOIN TINCTURE PRP APPL 2/3 (GAUZE/BANDAGES/DRESSINGS) ×3 IMPLANT
CHLORAPREP W/TINT 26ML (MISCELLANEOUS) ×3 IMPLANT
CLOSURE STERI STRIP 1/2 X4 (GAUZE/BANDAGES/DRESSINGS) ×1 IMPLANT
CLOSURE WOUND 1/2 X4 (GAUZE/BANDAGES/DRESSINGS) ×1
CLOTH BEACON ORANGE TIMEOUT ST (SAFETY) ×3 IMPLANT
DRSG OPSITE POSTOP 4X10 (GAUZE/BANDAGES/DRESSINGS) ×3 IMPLANT
ELECT REM PT RETURN 9FT ADLT (ELECTROSURGICAL) ×3
ELECTRODE REM PT RTRN 9FT ADLT (ELECTROSURGICAL) ×1 IMPLANT
GLOVE BIOGEL PI IND STRL 7.0 (GLOVE) ×3 IMPLANT
GLOVE BIOGEL PI INDICATOR 7.0 (GLOVE) ×6
GLOVE ECLIPSE 6.5 STRL STRAW (GLOVE) ×3 IMPLANT
GOWN STRL REUS W/ TWL LRG LVL3 (GOWN DISPOSABLE) ×2 IMPLANT
GOWN STRL REUS W/TWL LRG LVL3 (GOWN DISPOSABLE) ×6
NEEDLE HYPO 22GX1.5 SAFETY (NEEDLE) ×3 IMPLANT
NS IRRIG 1000ML POUR BTL (IV SOLUTION) ×3 IMPLANT
PAD ABD 7.5X8 STRL (GAUZE/BANDAGES/DRESSINGS) ×2 IMPLANT
PAD OB MATERNITY 4.3X12.25 (PERSONAL CARE ITEMS) ×3 IMPLANT
PAD PREP 24X48 CUFFED NSTRL (MISCELLANEOUS) ×3 IMPLANT
RETRACTOR WND ALEXIS 25 LRG (MISCELLANEOUS) IMPLANT
RTRCTR WOUND ALEXIS 25CM LRG (MISCELLANEOUS)
SPONGE GAUZE 4X4 12PLY STER LF (GAUZE/BANDAGES/DRESSINGS) ×4 IMPLANT
STRIP CLOSURE SKIN 1/2X4 (GAUZE/BANDAGES/DRESSINGS) ×2 IMPLANT
SUT PLAIN 2 0 XLH (SUTURE) ×3 IMPLANT
SUT VIC AB 0 CT1 36 (SUTURE) ×6 IMPLANT
SUT VIC AB 2-0 CT1 (SUTURE) ×2 IMPLANT
SUT VIC AB 2-0 CT1 27 (SUTURE) ×3
SUT VIC AB 2-0 CT1 TAPERPNT 27 (SUTURE) ×1 IMPLANT
SUT VIC AB 4-0 KS 27 (SUTURE) ×3 IMPLANT
SYR CONTROL 10ML LL (SYRINGE) ×3 IMPLANT
TOWEL OR 17X24 6PK STRL BLUE (TOWEL DISPOSABLE) ×9 IMPLANT
TRAY FOLEY CATH SILVER 16FR (SET/KITS/TRAYS/PACK) ×3 IMPLANT

## 2016-10-24 NOTE — Lactation Note (Addendum)
This note was copied from a baby's chart. Lactation Consultation Note  Patient Name: Julia [name] Reason for consult: Initial assessment   Initial consult with Exp BF mom of 1 hour old infant in PACU. Infant in CN under OH currently. Mom was able to show me that she knew how to hand express. We hand expressed 7 cc colostrum that was taken to CN for infant when ready to feed.   Mom reports she BF her 35 yo for 1 year without difficulties. She reports she BF her 35 yo for 3 months and stopped as she was busier with 2 kids and she had a low milk supply, she denies early supplementation. Maternal history of anemia and PIH.  Mom with compressible pendulous breasts with everted nipples. Colostrum easily expressible from both breasts.   Discussed with mom that if infant not able to return to her within a few hours we will set her up with DEBP to begin pumping. Discussed BF feed 8-12 x in 24 hours at first feeding cues. Enc mom to hand express prior to latch and after BF to apply to nipples. Discussed that we will pump and hand express for infant if he is not able [place] to breast. Mom voiced understanding.   Mom asked about smoking and BF. Discussed with mom not smoking in the house or near infant as it increases risk of SIDS and to wash hands and change clothing post smoking before holding infant. Discussed that smoking inhibits letdown and if she needs to smoke to smoke after the infant has finished BF.   BF Resources Handout and LC Brochure given, mom informed of IP/OP Services, BF Support Groups and LC phone #. Enc mom to call WIC after d/c to make appt. Enc mom to call out to desk for feeding assistance as needed. Follow up later today.    Maternal Data Formula Feeding for Exclusion: No Has patient been taught Hand Expression?: Yes Does the patient have breastfeeding experience prior to this delivery?: Yes  Feeding    LATCH Score/Interventions                       Lactation Tools Discussed/Used WIC Program: Yes   Consult Status Consult Status: Follow-up Date: 10/24/16 Follow-up type: In-patient    [name], 12:34 PM

## 2016-10-24 NOTE — Addendum Note (Signed)
Addendum  created 10/24/16 1639 by Yolonda KidaAlison L Jafeth Mustin, CRNA   Sign clinical note

## 2016-10-24 NOTE — Anesthesia Postprocedure Evaluation (Signed)
Anesthesia Post Note  Patient: Julia [name]  Procedure(s) Performed: Procedure(s) (LRB): CESAREAN SECTION (N/A)  Patient location during evaluation: PACU Anesthesia Type: Spinal Level of consciousness: awake and alert Pain management: pain level controlled Vital Signs Assessment: post-procedure vital signs reviewed and stable Respiratory status: spontaneous breathing and respiratory function stable Cardiovascular status: blood pressure returned to baseline and stable Postop Assessment: spinal receding Anesthetic complications: no        Last Vitals:  Vitals:   10/24/16 1146 10/24/16 1201  BP: 121/79 117/72  Pulse: 61 60  Resp: 12 17  Temp:      Last Pain:  Vitals:   10/24/16 1201  TempSrc:   PainSc: 0-No pain   Pain Goal: Patients Stated Pain Goal: 2 (10/24/16 0855)               Kennieth RadFitzgerald, Edmond Ginsberg E

## 2016-10-24 NOTE — Transfer of Care (Signed)
Immediate Anesthesia Transfer of Care Note  Patient: Julia [name]  Procedure(s) Performed: Procedure(s): CESAREAN SECTION (N/A)  Patient Location: PACU  Anesthesia Type:Spinal  Level of Consciousness: sedated  Airway & Oxygen Therapy: Patient Spontanous Breathing  Post-op Assessment: Report given to RN  Post vital signs: Reviewed and stable  Last Vitals:  Vitals:   10/24/16 0855  BP: 114/69  Pulse: 86  Resp: 16  Temp: 36.7 C    Last Pain:  Vitals:   10/24/16 0855  TempSrc: Oral      Patients Stated Pain Goal: 2 (10/24/16 0855)  Complications: No apparent anesthesia complications

## 2016-10-24 NOTE — Lactation Note (Addendum)
This note was copied from a baby's chart. Lactation Consultation Note  Patient Name: Boy Ulyses AmorSelina Dungee ZOXWR'UToday's Date: 10/24/2016 Reason for consult: Follow-up assessment;Other (Comment) (Baby in CN post O2 requirements)   Follow up with infant in CN. Finger fed with curved tip syringe 3 cc Colostrum, infant tolerated well. O2 sats 95-99% throughout feeding and HR and RR wnl. Report to TehamaKristen, Charity fundraiserN. Infant remains under warmer in CN. Report to Kelby AlineEmily Howard to set up pump later today if infant not able to return to mother.    Maternal Data Formula Feeding for Exclusion: No Has patient been taught Hand Expression?: Yes Does the patient have breastfeeding experience prior to this delivery?: Yes  Feeding Feeding Type: Breast Milk  LATCH Score/Interventions                      Lactation Tools Discussed/Used WIC Program: Yes   Consult Status Consult Status: Follow-up Date: 10/25/16 Follow-up type: In-patient    Silas FloodSharon S Jhett Fretwell 10/24/2016, 2:22 PM

## 2016-10-24 NOTE — Lactation Note (Signed)
This note was copied from a baby's chart. Lactation Consultation Note  Patient Name: Julia [name] ZOXWR'UToday's Date: 10/24/2016 Reason for consult: Follow-up assessment Baby at 5 hr of life. Baby is back in the room with mom and family is resting. Baby is sts with mom. Mom reports that baby latched "like a pro" when he came back from the CN. She denies breast or nipple pain, voiced no concerns. Left lactation phone number on the white board for mom to call at the next bf.   Maternal Data Formula Feeding for Exclusion: No Has patient been taught Hand Expression?: Yes Does the patient have breastfeeding experience prior to this delivery?: Yes  Feeding Feeding Type: Breast Fed Length of feed: 20 min (10 minutes each side per mom)  LATCH Score/Interventions                      Lactation Tools Discussed/Used     Consult Status Consult Status: Follow-up Date: 10/24/16 Follow-up type: In-patient    Rulon Eisenmengerlizabeth E Lyrik Buresh 10/24/2016, 4:19 PM

## 2016-10-24 NOTE — Anesthesia Postprocedure Evaluation (Signed)
Anesthesia Post Note  Patient: Julia [name]  Procedure(s) Performed: Procedure(s) (LRB): CESAREAN SECTION (N/A)  Patient location during evaluation: Mother Baby Anesthesia Type: Spinal Level of consciousness: awake, awake and alert, oriented and patient cooperative Pain management: pain level controlled Vital Signs Assessment: post-procedure vital signs reviewed and stable Respiratory status: spontaneous breathing, nonlabored ventilation and respiratory function stable Cardiovascular status: stable Postop Assessment: no headache, no backache, patient able to bend at knees and no signs of nausea or vomiting Anesthetic complications: no        Last Vitals:  Vitals:   10/24/16 1445 10/24/16 1545  BP: 110/80 111/71  Pulse: (!) 56 (!) 54  Resp: 17 18  Temp: 36.6 C     Last Pain:  Vitals:   10/24/16 1545  TempSrc:   PainSc: 0-No pain   Pain Goal: Patients Stated Pain Goal: 2 (10/24/16 0855)               Onnika Siebel L

## 2016-10-24 NOTE — Op Note (Signed)
Cesarean Section Operative Report  Julia [name]  10/24/2016  Indications: Elective Repeat   Pre-operative Diagnosis: Elective REPEAT c-section.   Post-operative Diagnosis: Same   Surgeon: Surgeon(s) and Role:    * Federico FlakeKimberly Niles Newton, MD - Primary    * Lorne SkeensNicholas Michael Calan Doren, MD - Fellow   Assistants: none  Anesthesia: spinal    Estimated Blood Loss: 650 ml  Total IV Fluids: 2500 ml LR  Urine Output:: 25 ml clear urine  Specimens: none  Findings: Viable female infant in vertex presentation; Apgars pending;  clear amniotic fluid; intact placenta with three vessel cord; normal uterus, fallopian tubes and ovaries bilaterally. Numerous clear cysts noted on omentum.   Baby condition / location:  Nursery   Complications: no complications  Indications: Julia [name] is a 36 y.o. [id number] with an IUP 7745w0d presenting for elective repeat c-section.  Procedure Details:  The patient was taken back to the operative suite where spinal anesthesia was placed.  A time out was held and the above information confirmed.   After induction of anesthesia, the patient was draped and prepped in the usual sterile manner and placed in a dorsal supine position with a leftward tilt. A Pfannenstiel incision was made and carried down through the subcutaneous tissue to the fascia. Fascial incision was made and sharpy extended transversely. The fascia was separated from the underlying rectus tissue superiorly and inferiorly. The peritoneum was identified and bluntly entered and extended longitudinally. Alexis retractor was placed. A low transverse uterine incision was made and extended bluntly. Delivered from cephalic presentation was a viable infant with Apgars and weight as above.  After waiting 60 seconds for delayed cord cutting, the umbilical cord was clamped and cut cord blood was obtained for evaluation. Cord ph was not sent. The placenta was removed Intact and appeared normal. The uterine outline,  tubes and ovaries appeared normal. The uterine incision was closed with running locked sutures of 0Vicryl with an imbricating layer of the same.  A single figure of eight was placed. Hemostasis was observed. The peritoneum was closed with 0 Vicryl. A single interrupted 0 vicryl stich re approximated the rectus.  The rectus muscles were examined and hemostasis observed. The fascia was then reapproximated with running sutures of 0Vicryl. A total of 30 ml 0.25% Marcaine was injected subcutaneously at the margins of the incision. The skin was closed with 3-0Vicryl.   Instrument, sponge, and needle counts were correct prior the abdominal closure and were correct at the conclusion of the case.     Disposition: PACU - hemodynamically stable.       SignedLes Pou: Gracielynn Birkel SchenkMD 10/24/2016 12:52 PM

## 2016-10-24 NOTE — Anesthesia Procedure Notes (Signed)
Spinal  Staffing Anesthesiologist: Jaimie Redditt Performed: anesthesiologist  Preanesthetic Checklist Completed: patient identified, site marked, surgical consent, pre-op evaluation, timeout performed, IV checked, risks and benefits discussed and monitors and equipment checked Spinal Block Patient position: sitting Prep: site prepped and draped and DuraPrep Patient monitoring: heart rate, continuous pulse ox and blood pressure Approach: midline Location: L4-5 Injection technique: single-shot Needle Needle type: Pencan  Needle gauge: 24 G Needle length: 9 cm Needle insertion depth: 6 cm Assessment Sensory level: T6     
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
## 2016-10-24 NOTE — Anesthesia Preprocedure Evaluation (Signed)
Anesthesia Evaluation  Patient identified by MRN, date of birth, ID band  Reviewed: Allergy & Precautions, NPO status , Patient's Chart, lab work & pertinent test results  Airway Mallampati: II  TM Distance: >3 FB Neck ROM: Full    Dental   Pulmonary former smoker,    breath sounds clear to auscultation       Cardiovascular negative cardio ROS   Rhythm:Regular Rate:Normal     Neuro/Psych Depression    GI/Hepatic negative GI ROS, Neg liver ROS,   Endo/Other  negative endocrine ROS  Renal/GU negative Renal ROS     Musculoskeletal   Abdominal   Peds  Hematology  (+) anemia ,   Anesthesia Other Findings   Reproductive/Obstetrics (+) Pregnancy                             Lab Results  Component Value Date   WBC 7.0 10/23/2016   HGB 9.3 (L) 10/23/2016   HCT 26.2 (L) 10/23/2016   MCV 84.2 10/23/2016   PLT 229 10/23/2016   No results found for: CREATININE, BUN, NA, K, CL, CO2  Anesthesia Physical Anesthesia Plan  ASA: II  Anesthesia Plan: Spinal   Post-op Pain Management:    Induction:   Airway Management Planned: Natural Airway  Additional Equipment:   Intra-op Plan:   Post-operative Plan:   Informed Consent: I have reviewed the patients History and Physical, chart, labs and discussed the procedure including the risks, benefits and alternatives for the proposed anesthesia with the patient or authorized representative who has indicated his/her understanding and acceptance.     Plan Discussed with:   Anesthesia Plan Comments:         Anesthesia Quick Evaluation

## 2016-10-25 ENCOUNTER — Encounter (HOSPITAL_COMMUNITY): Payer: Self-pay | Admitting: *Deleted

## 2016-10-25 DIAGNOSIS — Z98891 History of uterine scar from previous surgery: Secondary | ICD-10-CM

## 2016-10-25 LAB — BIRTH TISSUE RECOVERY COLLECTION (PLACENTA DONATION)

## 2016-10-25 LAB — CBC
HEMATOCRIT: 21.7 % — AB (ref 36.0–46.0)
HEMOGLOBIN: 7.7 g/dL — AB (ref 12.0–15.0)
MCH: 29.6 pg (ref 26.0–34.0)
MCHC: 35.5 g/dL (ref 30.0–36.0)
MCV: 83.5 fL (ref 78.0–100.0)
Platelets: 206 10*3/uL (ref 150–400)
RBC: 2.6 MIL/uL — ABNORMAL LOW (ref 3.87–5.11)
RDW: 13.8 % (ref 11.5–15.5)
WBC: 9.6 10*3/uL (ref 4.0–10.5)

## 2016-10-25 LAB — GLUCOSE, CAPILLARY: Glucose-Capillary: 90 mg/dL (ref 65–99)

## 2016-10-25 MED ORDER — LACTATED RINGERS IV BOLUS (SEPSIS)
500.0000 mL | Freq: Once | INTRAVENOUS | Status: AC
  Administered 2016-10-25: 500 mL via INTRAVENOUS

## 2016-10-25 MED ORDER — POLYSACCHARIDE IRON COMPLEX 150 MG PO CAPS
150.0000 mg | ORAL_CAPSULE | Freq: Two times a day (BID) | ORAL | Status: DC
  Administered 2016-10-25 – 2016-10-26 (×4): 150 mg via ORAL
  Filled 2016-10-25 (×4): qty 1

## 2016-10-25 NOTE — Progress Notes (Signed)
Patient called out to want to be unhooked from her bolus.. There was still 150cc to be infused but she did not want to be hooked up anymore. She wanted to go outside.

## 2016-10-25 NOTE — Progress Notes (Signed)
Patient states she is not dizzy. Bolus started per MD order. Patient states she isn't passing gas yet. Patient told to ambulate halls and use incentive spirometer. Pain medication schedule told.

## 2016-10-25 NOTE — Progress Notes (Signed)
CM / UR chart review completed.  

## 2016-10-25 NOTE — Progress Notes (Signed)
Patient told to not get up without help. Post Fall huddle done and protocol implemented. Fall risk sign on door, yellow socks on, Fall Risk band on. Told to call out for help tolieting. Mother told  Not to get up AT ALL even if FOB is in the room for help. Will monitor. CNM in to see patient already.

## 2016-10-25 NOTE — Progress Notes (Signed)
Called by RN to see pt re: passed out. Per RN report she had been given Oxycodone then proceeded to walk outside and smoke a cigarette. She then came back and got into the shower and began to feel faint. She walked to the bed and fell down against the bed. She had a brief LOC. She did not hit her head.   S:  Pt feeling better, no c/o. Pain minimal.  O: VS:  Temp:  [97.7 F (36.5 C)-98.6 F (37 C)] 98 F (36.7 C) (01/04 1752) Pulse Rate:  [57-78] 65 (01/04 1820) Resp:  [18-22] 18 (01/04 1820) BP: (91-145)/(50-101) 91/77 (01/04 1820) SpO2:  [97 %-98 %] 97 % (01/04 1820) Const: alert and oriented  Heart: regular rate Lungs: regular rate and effort Neuro: grossly intact  A/P: Syncopal episode S/p CS POD #1 Rest Hydrate Maintain good nutrition Continue routine post-op orders

## 2016-10-25 NOTE — Progress Notes (Addendum)
I was called out of another patient room by a secretary to tell me that my patient in 101 had passed out. I did not see or witness the event but FOB did. FOB told me what had happened that the patient had come out of the shower going to the bed when her legs gave out and she fainted per FOB. She hit the edge of the bed but FOB claims she did not hit her head. VS after fall was 145/101 pulse 78. Patient's fundus firm and no bleeding noted. Patient states nothing hurts at all. Claud KelpMelanie Bambri CNM notifed of the fall and actions leading up to the fall. House coverage and charge RN Notified post fall huddle will be done. Safety zone will be completed. Prior to falling and prior to the shower patient had ambulate all the way outside. Will monitor.

## 2016-10-25 NOTE — Lactation Note (Signed)
This note was copied from a baby's chart. Lactation Consultation Note  Patient Name: Julia [name] XBJYN'WToday's Date: 10/25/2016 Reason for consult: Follow-up assessment   Follow up with mom of 30 hour old infant. Mom was resting and infant in the nursery currently. Mom reports BF is going well. She denies nipple pain or need for assistance at this time. Enc mom to call for assistance as needed.    Maternal Data Formula Feeding for Exclusion: No  Feeding Feeding Type: Breast Fed Length of feed: 15 min  LATCH Score/Interventions                      Lactation Tools Discussed/Used     Consult Status Consult Status: Follow-up Date: 10/26/16 Follow-up type: In-patient    Silas FloodSharon S Hice 10/25/2016, 4:52 PM

## 2016-10-25 NOTE — Progress Notes (Signed)
MOB was referred for history of depression/anxiety. * Referral screened out by Clinical Social Worker because none of the following criteria appear to apply: ~ History of anxiety/depression during this pregnancy, or of post-partum depression. ~ Diagnosis of anxiety and/or depression within last 3 years OR * MOB's symptoms currently being treated with medication and/or therapy. Please contact the Clinical Social Worker if needs arise, or if MOB requests.  Sharnise Blough Boyd-Gilyard, MSW, LCSW Clinical Social Work [phone] 

## 2016-10-25 NOTE — Progress Notes (Signed)
Patient was supposed to urinate in the hat a 2nd time. I asked her if she felt like she was emptying her bladder ok she said yes. NO  Flatus still at this time. Patient walked halls once encouraged to walk again.

## 2016-10-25 NOTE — Progress Notes (Signed)
Subjective: Postpartum Day #1: Cesarean Delivery Patient reports incisional pain and no problems voiding.  Has not eaten yet, ordered breakfast.  Reports breast feeding is going well.    Objective: Vital signs in last 24 hours: Temp:  [96.2 F (35.7 C)-98.6 F (37 C)] 98.6 F (37 C) (01/04 0400) Pulse Rate:  [54-86] 60 (01/04 0400) Resp:  [12-20] 18 (01/04 0400) BP: (93-139)/(51-90) 93/51 (01/04 0400) SpO2:  [99 %-100 %] 99 % (01/03 1545)  Physical Exam:  General: alert, cooperative and no distress Lochia: appropriate Uterine Fundus: firm Incision: no significant drainage, no dehiscence, no significant erythema DVT Evaluation: No evidence of DVT seen on physical exam. No cords or calf tenderness. No significant calf/ankle edema.   Recent Labs  10/23/16 1210 10/25/16 0513  HGB 9.3* 7.7*  HCT 26.2* 21.7*    Assessment/Plan: Status post Cesarean section. Postoperative course complicated by anemia: iron ordered.  Hypotensive on last b/p check: IVF bolus 500ml LR ordered. Repeat CBC in AM.  Continue current care.  Roe Coombsachelle A Daking Westervelt, CNM 10/25/2016, 8:00 AM

## 2016-10-26 LAB — CBC
HCT: 21.2 % — ABNORMAL LOW (ref 36.0–46.0)
Hemoglobin: 7.4 g/dL — ABNORMAL LOW (ref 12.0–15.0)
MCH: 29.5 pg (ref 26.0–34.0)
MCHC: 34.9 g/dL (ref 30.0–36.0)
MCV: 84.5 fL (ref 78.0–100.0)
PLATELETS: 226 10*3/uL (ref 150–400)
RBC: 2.51 MIL/uL — ABNORMAL LOW (ref 3.87–5.11)
RDW: 14 % (ref 11.5–15.5)
WBC: 8.4 10*3/uL (ref 4.0–10.5)

## 2016-10-26 MED ORDER — MEPERIDINE HCL 25 MG/ML IJ SOLN: 6.2500 mg | INTRAMUSCULAR | Status: DC | PRN

## 2016-10-26 NOTE — Progress Notes (Signed)
Subjective: Postpartum Day #2: Cesarean Delivery, RLTCS Patient reports incisional pain, tolerating PO, + flatus and no problems voiding.  Breast feeding is going well.  Had an episode of syncope on 10/25/16 after going outside and then back inside and showering.  Has never fainted before.  Blood pressure was up after the fall, normotensive since.  Does not report any more syncope episodes since.    Objective: Vital signs in last 24 hours: Temp:  [98 F (36.7 C)-98.2 F (36.8 C)] 98.2 F (36.8 C) (01/05 0400) Pulse Rate:  [61-78] 68 (01/05 0400) Resp:  [16-22] 16 (01/05 0400) BP: (91-145)/(68-101) 104/72 (01/05 0400) SpO2:  [97 %-98 %] 98 % (01/04 1910)  Physical Exam:  General: alert, cooperative and no distress Lochia: appropriate Uterine Fundus: firm Incision: no significant drainage, no dehiscence, no significant erythema DVT Evaluation: No evidence of DVT seen on physical exam. No cords or calf tenderness. No significant calf/ankle edema.   Recent Labs  10/25/16 0513 10/26/16 0537  HGB 7.7* 7.4*  HCT 21.7* 21.2*    Assessment/Plan: Status post Cesarean section. Postoperative course complicated by anemia: VSS, 1 episode of syncope  Continue current care.  Julia [name], CNM 10/26/2016, 8:34 AM

## 2016-10-26 NOTE — Lactation Note (Signed)
This note was copied from a baby's chart. Lactation Consultation Note  Patient Name: Boy Ulyses AmorSelina Dungee WUJWJ'XToday's Date: 10/26/2016 Reason for consult: Follow-up assessment   With this mom and term baby, now 4048 hours old. Mom reports breast feeding going well. Hearing lots of swallows. On exam of mom's breasts, easily expressed transitional milk.  Baby was covered in receiving blanket over face, and thick blanket doubled and over baby also. Room  Temperature was 80 degrees. I reviewed with mom how the baby needs about one more layer of clothing than her, and how the thick blanket was not needed and was also unsafe and why. I got more hospital blankets for baby, and wrapped the baby with blankets under his arms, and one tucked in under his matress, explaining to mom how this protect him from blankets covering his face. I also, with mom's permission, decreased the room temperature to 75 degrees, and encouraged mom to regulate the room temperature to what is more comfortable. I also pointed out that the baby was very red/flushed in appearance, and how overheating can lead to sids.  Mom knows to call for questions/conerns. Mom very receptive to teaching.    Maternal Data    Feeding Feeding Type: Breast Fed Length of feed: 15 min  LATCH Score/Interventions Latch: Grasps breast easily, tongue down, lips flanged, rhythmical sucking.  Audible Swallowing: Spontaneous and intermittent  Type of Nipple: Everted at rest and after stimulation  Comfort (Breast/Nipple): Soft / non-tender     Hold (Positioning): No assistance needed to correctly position infant at breast.  LATCH Score: 10  Lactation Tools Discussed/Used     Consult Status Consult Status: PRN Follow-up type: Call as needed    Alfred LevinsLee, Xaidyn Kepner Anne 10/26/2016, 11:15 AM

## 2016-10-26 NOTE — Lactation Note (Signed)
This note was copied from a baby's chart. Lactation Consultation Note Baby had 8% weight loss at 40 hrs. Of age, very spitty after delivery, wasn't interested in BF at that time. Baby had 5 voids, 7 stools, and 4 documented emesis. Has latch 9. Discussed w/mom weight loss. Mom stated baby is BF great now since he has stopped spitting up.  LC feels output is reason for weight loss. Experienced BF mom over 1 yr. Patient Name: Julia [name]: 10/26/2016 Reason for consult: Follow-up assessment;Infant weight loss   Maternal Data    Feeding Feeding Type: Breast Fed Length of feed: 15 min  LATCH Score/Interventions Latch: Grasps breast easily, tongue down, lips flanged, rhythmical sucking. Intervention(s): Adjust position;Assist with latch  Audible Swallowing: Spontaneous and intermittent Intervention(s): Skin to skin;Hand expression;Alternate breast massage  Type of Nipple: Everted at rest and after stimulation  Comfort (Breast/Nipple): Soft / non-tender  Interventions (Filling): Frequent nursing;Massage  Hold (Positioning): No assistance needed to correctly position infant at breast. Intervention(s): Support Pillows  LATCH Score: 10  Lactation Tools Discussed/Used     Consult Status Consult Status: PRN [name]: 10/26/16 Follow-up type: In-patient    Charyl DancerCARVER, Mauri Tolen G 10/26/2016, 3:57 AM

## 2016-10-27 ENCOUNTER — Encounter (HOSPITAL_COMMUNITY): Payer: Self-pay | Admitting: Family Medicine

## 2016-10-27 MED ORDER — IBUPROFEN 600 MG PO TABS: 600.0000 mg | ORAL_TABLET | Freq: Four times a day (QID) | ORAL | 0 refills | Status: DC | PRN

## 2016-10-27 MED ORDER — POLYSACCHARIDE IRON COMPLEX 150 MG PO CAPS: 150.0000 mg | ORAL_CAPSULE | Freq: Two times a day (BID) | ORAL | 1 refills | Status: DC

## 2016-10-27 MED ORDER — OXYCODONE HCL 5 MG PO TABS: 5.0000 mg | ORAL_TABLET | ORAL | 0 refills | Status: DC | PRN

## 2016-10-27 NOTE — Progress Notes (Signed)
Mother states she wants to go outside. I asked her if she felt dizzy and if she was ok to go out for a walk and she reassured me that she felt fine. I told her I did not want her to fall again. She said she felt fine.

## 2016-10-27 NOTE — Discharge Summary (Signed)
OB Discharge Summary     Patient Name: Julia AmorSelina [name] DOB: 11/14/1980 MRN: [id number]  Date of admission: 10/24/2016 Delivering MD: Lyndel SafeNEWTON, KIMBERLY NILES   Date of discharge: 10/27/2016  Admitting diagnosis: cpt [phone] - REPEAT c-section Intrauterine pregnancy: 9037w0d     Secondary diagnosis:  Active Problems:   Supervision of normal pregnancy   Previous cesarean delivery x 2   Pap smear abnormality of cervix with LGSIL   Tobacco use complicating pregnancy   S/P repeat low transverse C-section  Additional problems: none     Discharge diagnosis: Term Pregnancy Delivered and Anemia                                                                                                Post partum procedures:none  Augmentation: N/A  Complications: None  Hospital course:  Sceduled C/S   36 y.o. yo [id number] at 6337w0d was admitted to the hospital 10/24/2016 for scheduled cesarean section with the following indication:Elective Repeat.  Membrane Rupture Time/Date: 10:41 AM ,10/24/2016   Patient delivered a Viable infant.10/24/2016  Details of operation can be found in separate operative note.  Pateint had an uncomplicated postpartum course.  She is ambulating, tolerating a regular diet, passing flatus, and urinating well. Patient is discharged home in stable condition on  10/27/16          Physical exam Vitals:   10/26/16 0800 10/26/16 1200 10/26/16 1600 10/27/16 0600  BP: 118/64 122/63 137/82 125/79  Pulse: 80 67 62 63  Resp: 18 16 18 18   Temp: 98.2 F (36.8 C) 98.4 F (36.9 C) 98.5 F (36.9 C) 98.4 F (36.9 C)  TempSrc: Oral Oral Oral Oral  SpO2: 100% 100% 100%    General: alert and cooperative Lochia: appropriate Uterine Fundus: firm Incision: honeycomb intact w/ mod serous drainage DVT Evaluation: No evidence of DVT seen on physical exam. Labs: Lab Results  Component Value Date   WBC 8.4 10/26/2016   HGB 7.4 (L) 10/26/2016   HCT 21.2 (L) 10/26/2016   MCV 84.5 10/26/2016   PLT 226  10/26/2016   No flowsheet data found.  Discharge instruction: per After Visit Summary and "Baby and Me Booklet".  After visit meds:  Allergies as of 10/27/2016   No Known Allergies     Medication List    STOP taking these medications   metroNIDAZOLE 500 MG tablet Commonly known as:  FLAGYL     TAKE these medications   ibuprofen 600 MG tablet Commonly known as:  ADVIL,MOTRIN Take 1 tablet (600 mg total) by mouth every 6 (six) hours as needed.   iron polysaccharides 150 MG capsule Commonly known as:  NIFEREX Take 1 capsule (150 mg total) by mouth 2 (two) times daily.   oxyCODONE 5 MG immediate release tablet Commonly known as:  Oxy IR/ROXICODONE Take 1-2 tablets (5-10 mg total) by mouth every 4 (four) hours as needed (pain scale 4-7 take 1 tablet, for pain >7 take 2 tablets).   sertraline 50 MG tablet Commonly known as:  ZOLOFT Take 1 tablet (50 mg total) by mouth daily.   VITAFOL FE+  90-1-200 & 50 MG Cppk Take 2 tablets by mouth daily before breakfast.       Diet: routine diet  Activity: Advance as tolerated. Pelvic rest for 6 weeks.   Outpatient follow up:6 weeks Follow up Appt:Future Appointments Date Time Provider Department Center  11/22/2016 1:00 PM Catalina Antigua, MD [facility] None   Follow up Visit:No Follow-up on file.  Postpartum contraception: Nexplanon  Newborn Data: Live born female  Birth Weight: 6 lb 11.9 oz (3060 g) APGAR: 8, 8  Baby Feeding: Breast Disposition:home with mother   10/27/2016 Cam Hai, CNM  7:35 AM

## 2016-10-27 NOTE — Progress Notes (Signed)
Patient states she feels fine. No dizzyness, nothing hurts besides incision. Bruising noted on upper abdomen above incision. Patient stated she is voiding fine and passing gas. Patient instructed when and how to take dressing off and to watch for infection. Patient anxious to go home.

## 2016-11-22 ENCOUNTER — Ambulatory Visit: Payer: Medicaid Other | Admitting: Obstetrics and Gynecology

## 2016-12-04 ENCOUNTER — Telehealth: Payer: Self-pay

## 2016-12-04 ENCOUNTER — Encounter: Payer: Self-pay | Admitting: Obstetrics & Gynecology

## 2016-12-04 ENCOUNTER — Encounter (HOSPITAL_COMMUNITY): Payer: Self-pay

## 2016-12-04 ENCOUNTER — Ambulatory Visit (INDEPENDENT_AMBULATORY_CARE_PROVIDER_SITE_OTHER): Payer: Medicaid Other | Admitting: Obstetrics & Gynecology

## 2016-12-04 ENCOUNTER — Emergency Department (HOSPITAL_COMMUNITY)
Admission: EM | Admit: 2016-12-04 | Discharge: 2016-12-04 | Disposition: A | Discharge status: Home / Self Care | Payer: Medicaid Other | Attending: Emergency Medicine | Admitting: Emergency Medicine

## 2016-12-04 VITALS — BP 169/121 | HR 73 | Wt 170.0 lb

## 2016-12-04 DIAGNOSIS — Z79899 Other long term (current) drug therapy: Secondary | ICD-10-CM | POA: Diagnosis not present

## 2016-12-04 DIAGNOSIS — F329 Major depressive disorder, single episode, unspecified: Secondary | ICD-10-CM | POA: Diagnosis not present

## 2016-12-04 DIAGNOSIS — O906 Postpartum mood disturbance: Secondary | ICD-10-CM | POA: Insufficient documentation

## 2016-12-04 DIAGNOSIS — Z30430 Encounter for insertion of intrauterine contraceptive device: Secondary | ICD-10-CM

## 2016-12-04 DIAGNOSIS — F17210 Nicotine dependence, cigarettes, uncomplicated: Secondary | ICD-10-CM | POA: Insufficient documentation

## 2016-12-04 DIAGNOSIS — F53 Postpartum depression: Secondary | ICD-10-CM

## 2016-12-04 DIAGNOSIS — Z30014 Encounter for initial prescription of intrauterine contraceptive device: Secondary | ICD-10-CM | POA: Diagnosis not present

## 2016-12-04 DIAGNOSIS — O99345 Other mental disorders complicating the puerperium: Secondary | ICD-10-CM

## 2016-12-04 DIAGNOSIS — Z3202 Encounter for pregnancy test, result negative: Secondary | ICD-10-CM | POA: Diagnosis not present

## 2016-12-04 LAB — COMPREHENSIVE METABOLIC PANEL
ALK PHOS: 116 U/L (ref 38–126)
ALT: 29 U/L (ref 14–54)
AST: 58 U/L — AB (ref 15–41)
Albumin: 4.1 g/dL (ref 3.5–5.0)
Anion gap: 9 (ref 5–15)
BILIRUBIN TOTAL: 0.7 mg/dL (ref 0.3–1.2)
BUN: 8 mg/dL (ref 6–20)
CALCIUM: 9 mg/dL (ref 8.9–10.3)
CO2: 25 mmol/L (ref 22–32)
CREATININE: 0.68 mg/dL (ref 0.44–1.00)
Chloride: 107 mmol/L (ref 101–111)
GFR calc Af Amer: >60 mL/min (ref >60)
Glucose, Bld: 99 mg/dL (ref 65–99)
Potassium: 3.3 mmol/L — ABNORMAL LOW (ref 3.5–5.1)
Sodium: 141 mmol/L (ref 135–145)
TOTAL PROTEIN: 7.3 g/dL (ref 6.5–8.1)

## 2016-12-04 LAB — RAPID URINE DRUG SCREEN, HOSP PERFORMED
Amphetamines: NOT DETECTED
BARBITURATES: NOT DETECTED
Benzodiazepines: NOT DETECTED
Cocaine: NOT DETECTED
Opiates: NOT DETECTED
Tetrahydrocannabinol: POSITIVE — AB

## 2016-12-04 LAB — CBC
HCT: 37.1 % (ref 36.0–46.0)
Hemoglobin: 12.1 g/dL (ref 12.0–15.0)
MCH: 27.4 pg (ref 26.0–34.0)
MCHC: 32.6 g/dL (ref 30.0–36.0)
MCV: 83.9 fL (ref 78.0–100.0)
PLATELETS: 262 10*3/uL (ref 150–400)
RBC: 4.42 MIL/uL (ref 3.87–5.11)
RDW: 14.4 % (ref 11.5–15.5)
WBC: 5.2 10*3/uL (ref 4.0–10.5)

## 2016-12-04 LAB — ETHANOL

## 2016-12-04 LAB — I-STAT BETA HCG BLOOD, ED (MC, WL, AP ONLY)

## 2016-12-04 LAB — ACETAMINOPHEN LEVEL: Acetaminophen (Tylenol), Serum: <10 ug/mL — ABNORMAL LOW (ref 10–30)

## 2016-12-04 LAB — SALICYLATE LEVEL: Salicylate Lvl: <7 mg/dL (ref 2.8–30.0)

## 2016-12-04 MED ORDER — LEVONORGESTREL 20 MCG/24HR IU IUD
INTRAUTERINE_SYSTEM | Freq: Once | INTRAUTERINE | Status: AC
  Administered 2016-12-04: 12:00:00 via INTRAUTERINE

## 2016-12-04 NOTE — Telephone Encounter (Signed)
LVM with a female to inform pt to c/b. Received message pt has medication questions.

## 2016-12-04 NOTE — ED Triage Notes (Signed)
Pt here being sent from MD office.  [facility] Pt expressed thoughts of depression and wanting to hurt self.  Pt states off/on for a while.

## 2016-12-04 NOTE — Progress Notes (Signed)
Subjective:Depressed and has had suicidal thoughts     Julia AmorSelina [name] is a 36 y.o. female who presents for a postpartum visit. She is 6 weeks postpartum following a low cervical transverse Cesarean section. I have fully reviewed the prenatal and intrapartum course. The delivery was at 39 gestational weeks. Outcome: repeat cesarean section, low transverse incision. Anesthesia: spinal. Postpartum course has been unremarkable. Baby's course has been unremarkable. Baby is feeding by bottle - Similac Advance. Bleeding staining only. Bowel function is normal. Bladder function is normal. Patient is not sexually active. Contraception method is none. Postpartum depression screening: positive. 27 score The following portions of the patient's history were reviewed and updated as appropriate: allergies, current medications, past family history, past medical history, past social history, past surgical history and problem list.  Review of Systems Pertinent items are noted in HPI.   Objective:    Wt 170 lb (77.1 kg)   BMI 29.18 kg/m   General:  alert, mild distress and crying   Breasts:     Lungs: normal effort  Heart:     Abdomen: soft, non-tender; bowel sounds normal; no masses,  no organomegaly and incision dry and intact   Vulva:  not evaluated  Vagina: not evaluated  Cervix:     Corpus: not examined  Adnexa:  not evaluated  Rectal Exam: Not performed.       Patient identified, informed consent performed, signed copy in chart, time out was performed.  Urine pregnancy test negative.  Speculum placed in the vagina.  Cervix visualized.  Cleaned with Betadine x 2.  Grasped anteriourly with a single tooth tenaculum.  Uterus sounded to 8 cm.  Mirena IUD placed per manufacturer's recommendations.  Strings trimmed to 3 cm.   Patient given post procedure instructions and Mirena care card with expiration date.  Patient is asked to check IUD strings periodically and follow up in 4-6 weeks for IUD  check. Assessment:     6 weeks postpartum exam. Pap smear not done at today's visit.   Plan:    1. Contraception: IUD 2. String check and colposcopy at f/u 3. Follow up in: 2 weeks or as needed.   4. Depression with high score. [facility], refused offer of [facility] transport and stated she would drive herself.  Adam PhenixJames G Caitlan Chauca, MD 12/04/2016

## 2016-12-04 NOTE — ED Notes (Signed)
TTS called. They state that pt is next on their list to be seen.
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
## 2016-12-04 NOTE — ED Notes (Signed)
MD at bedside. 

## 2016-12-04 NOTE — ED Notes (Signed)
Upon this RN's arrival, pt is raising her voice and threatening to leave. She states that "we cannot keep her here." PA, AC, and CN made aware.

## 2016-12-04 NOTE — ED Provider Notes (Cosign Needed)
Patient care assumed from Sharilyn SitesLisa Sanders, PA-C at shift change. Please see her note for further.  Briefly, patient to ED with concerns for post partum depression. At shift change patient is medically clear and awaiting TTS consult. Plan is likely for discharge after TTS consult.  I spoke with TTS after their evaluation. They feel the patient is safe to be discharged with outpatient follow-up. She spoke with the supervising psychiatric nurse practitioner [name] who agrees with this plan as well. She would like the patient to follow up with Family services. TTS also spoke with the patient's mother who feels comfortable with the patient being discharged and at home. I went and discussed this plan with the patient. She denies suicidal ideations at this time. She feels safe going home. We'll provide her with follow-up with family services and I discussed strict and specific return precautions. I advised the patient to follow-up with their primary care provider this week. I advised the patient to return to the emergency department with new or worsening symptoms or new concerns. The patient verbalized understanding and agreement with plan.   Post partum depression     Julia FarrierWilliam Christine Morton, PA-C 12/04/16 1656

## 2016-12-04 NOTE — Patient Instructions (Signed)
Postpartum Depression and Baby Blues The postpartum period begins right after the birth of a baby. During this time, there is often a great amount of joy and excitement. It is also a time of many changes in the life of the parents. Regardless of how many times a mother gives birth, each child brings new challenges and dynamics to the family. It is not unusual to have feelings of excitement along with confusing shifts in moods, emotions, and thoughts. All mothers are at risk of developing postpartum depression or the "baby blues." These mood changes can occur right after giving birth, or they may occur many months after giving birth. The baby blues or postpartum depression can be mild or severe. Additionally, postpartum depression can go away rather quickly, or it can be a long-term condition. What are the causes? Raised hormone levels and the rapid drop in those levels are thought to be a main cause of postpartum depression and the baby blues. A number of hormones change during and after pregnancy. Estrogen and progesterone usually decrease right after the delivery of your baby. The levels of thyroid hormone and various cortisol steroids also rapidly drop. Other factors that play a role in these mood changes include major life events and genetics. What increases the risk? If you have any of the following risks for the baby blues or postpartum depression, know what symptoms to watch out for during the postpartum period. Risk factors that may increase the likelihood of getting the baby blues or postpartum depression include:  Having a personal or family history of depression.  Having depression while being pregnant.  Having premenstrual mood issues or mood issues related to oral contraceptives.  Having a lot of life stress.  Having marital conflict.  Lacking a social support network.  Having a baby with special needs.  Having health problems, such as diabetes.  What are the signs or  symptoms? Symptoms of baby blues include:  Brief changes in mood, such as going from extreme happiness to sadness.  Decreased concentration.  Difficulty sleeping.  Crying spells, tearfulness.  Irritability.  Anxiety.  Symptoms of postpartum depression typically begin within the first month after giving birth. These symptoms include:  Difficulty sleeping or excessive sleepiness.  Marked weight loss.  Agitation.  Feelings of worthlessness.  Lack of interest in activity or food.  Postpartum psychosis is a very serious condition and can be dangerous. Fortunately, it is rare. Displaying any of the following symptoms is cause for immediate medical attention. Symptoms of postpartum psychosis include:  Hallucinations and delusions.  Bizarre or disorganized behavior.  Confusion or disorientation.  How is this diagnosed? A diagnosis is made by an evaluation of your symptoms. There are no medical or lab tests that lead to a diagnosis, but there are various questionnaires that a health care provider may use to identify those with the baby blues, postpartum depression, or psychosis. Often, a screening tool called the Edinburgh Postnatal Depression Scale is used to diagnose depression in the postpartum period. How is this treated? The baby blues usually goes away on its own in 1-2 weeks. Social support is often all that is needed. You will be encouraged to get adequate sleep and rest. Occasionally, you may be given medicines to help you sleep. Postpartum depression requires treatment because it can last several months or longer if it is not treated. Treatment may include individual or group therapy, medicine, or both to address any social, physiological, and psychological factors that may play a role in the   depression. Regular exercise, a healthy diet, rest, and social support may also be strongly recommended. Postpartum psychosis is more serious and needs treatment right away.  Hospitalization is often needed. Follow these instructions at home:  Get as much rest as you can. Nap when the baby sleeps.  Exercise regularly. Some women find yoga and walking to be beneficial.  Eat a balanced and nourishing diet.  Do little things that you enjoy. Have a cup of tea, take a bubble bath, read your favorite magazine, or listen to your favorite music.  Avoid alcohol.  Ask for help with household chores, cooking, grocery shopping, or running errands as needed. Do not try to do everything.  Talk to people close to you about how you are feeling. Get support from your partner, family members, friends, or other new moms.  Try to stay positive in how you think. Think about the things you are grateful for.  Do not spend a lot of time alone.  Only take over-the-counter or prescription medicine as directed by your health care provider.  Keep all your postpartum appointments.  Let your health care provider know if you have any concerns. Contact a health care provider if: You are having a reaction to or problems with your medicine. Get help right away if:  You have suicidal feelings.  You think you may harm the baby or someone else. This information is not intended to replace advice given to you by your health care provider. Make sure you discuss any questions you have with your health care provider. Document Released: 07/12/2004 Document Revised: 03/15/2016 Document Reviewed: 07/20/2013 Elsevier Interactive Patient Education  2017 Elsevier Inc.  
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
## 2016-12-04 NOTE — Discharge Instructions (Signed)
Substance Abuse Treatment Programs ° °Intensive Outpatient Programs °[facility]     °[address]      °[place], [place]                   °[phone]      ° °[facility] °[address] °[place], [place] °[phone] ° °[facility]     °(Inpatient and outpatient)     °[address]           °[phone]   ° °[facility] °[phone] (Suboxone and Methadone) ° °[address]      °[place], [place] [address]      °[phone]      ° °[address] °[place], [place] °852-3033 ° °[facility] (Outpatient/Inpatient, [facility])    °(insurance only) [phone]      °       °[facility] (Groups & Residential) °[place], [place] °[phone] ° °   °[facility]     °[address]     °[place], [place]      °[phone]      ° °[facility] (for caregivers and family) °[address] °[place], [place] °336-299-4655 ° ° ° ° ° °Residential Treatment Programs °[facility]      °[address], [place], [place] [address]  °[phone]      ° °[facility] °[address] [place], [place] [address] °[phone] ° °[facility]        °336-248-8914      ° °[facility] °1-800-659-3381 ° °[facility]             °[address]                                         °[place], [place]                                                °877-615-2722 or [phone]                              ° °[facility] [place] °[address] °[place], [address] °[phone] ° °[facility]    °[address]      °[place], [place]     °[phone]      ° °[facility] °[address] °[place], [place] °[phone] ° °[facility]   °[address]     °[place], [place] [address]     °[phone]      °Admissions: 8am-3pm M-F ° °[facility] °[address] °[place],  [place] °336-227-7417 ° °[facility]: Residential Program (90 Days)   °[place], [place]      °336-725-8389 or 800-758-6077    ° °[facility] °[place], [place] °(Walk in Hours over the weekend or by referral) ° °[facility] °[address], [place],  [address] °([phone] ° °Crisis Mobile: [facility]:  1-877-626-1772 (for crisis response 24 hours a day) °[facility] Hotline:      1-800-256-2452 °Outpatient Psychiatry and Counseling ° °[facility]: Mobile Crisis   Management 24 hours:  1-877-626-1772 ° °[facility] sliding scale fee and walk in schedule: M-F 8am-12pm/1pm-3pm °[address]  °[place], [place] [address] °[phone] ° °[facility] °[address] °[place], [place] [address] °[phone] ° °[facility] ([facility])- new patient walk-in appointments available Monday - Friday 8am -3pm.          °[address] °[place], [place] [address] °336-676-6840 or crisis line- 336-676-6905 ° °[facility]/ Intensive Outpatient Therapy Program °[address] °[place], [place] [address] °336-832-9804 ° °[facility]                  °Crisis Services      °[phone]      °[address]     °[place], [place] [address]                ° °[facility]   °[facility] °[phone] °[address] °[place], [place] [address] ° ° °[facility]          °[address]  °[place], [place] [address]       °([phone] ° °[facility] °[address] °[place], [place] [address] °336-292-1510 ° °[facility]    °[address]    °[place], [place] [address]     °[phone]      ° °Parish McKinney, MD     °[address]     °[place] [address]     °[phone]     °  °[facility] °[address] °[place] [address] ° °[facility]     °[address]     °[place], [place]      °[phone]      ° °[facility] °Shamsher Ahluwalia, MD °[address] °[place], [place] [address] °[phone] ° °[facility]     °[address]     °[place], [place] [address]     °336-274-1237      ° °[facility] °[address] °[place], [place] [address] °336-854-4450 °Resources for Temporary Residential Assistance/Crisis Centers ° °DAY CENTERS °[facility] °M-F 8am-3pm   °[address] [place], [place] [address]   336-332-0824 °Services include: laundry, barbering, support groups, case management, phone  & computer access, showers, AA/NA mtgs, mental health/substance abuse nurse, job skills class, disability information, VA assistance, spiritual classes, etc.  ° °HOMELESS SHELTERS ° °[facility]     °[facility]   °[address], [place]     °[phone]       °       °[facility] (women and children)       °[address] °[place], [place] [address] °336-275-0820 °[email or web address] for application and process °Application Required ° °[facility]   °[address]    °[place] [address]     °[phone]       °             °[facility] °[address] °[place],  [address] °[phone] °336-235-0363(schedule application appt.) °Application Required ° °[facility] (women only)    °[address]     °[place],  [address]     °336-884-1039      °  Intake starts 6pm daily °Need valid ID, SSC, & Police report °[facility] [place] °[address] °[place], [place] °336-881-5420 °Application Required ° °[facility] (men only)     °[address]      °[place], [place]     °[phone]      ° °[facility] °(Pregnant women only) °[address] °, [place] °336-275-0206 ° °[facility]      °[address]      °[place], [place] [address]     °[phone]      °       °[facility] °[address] °[place], [place] °336-723-1848 °90 day commitment/SA/Application process ° °[facility](men only)     °[address]     °[place], [place]     °[phone]       °Check-in at 7pm     °       °[facility] °[address] °[place], [place] [address] °336-248-6684 °Men/Women/Women and Children must be there by 7 pm ° °[facility] °[place],  °[phone]                ° °

## 2016-12-05 NOTE — Telephone Encounter (Signed)
Unable to reach pt. LVM for pt to c/b

## 2017-01-22 ENCOUNTER — Encounter: Payer: Self-pay | Admitting: Obstetrics

## 2017-01-22 ENCOUNTER — Ambulatory Visit (INDEPENDENT_AMBULATORY_CARE_PROVIDER_SITE_OTHER): Payer: Medicaid Other | Admitting: Obstetrics

## 2017-01-22 ENCOUNTER — Other Ambulatory Visit (HOSPITAL_COMMUNITY)
Admission: RE | Admit: 2017-01-22 | Discharge: 2017-01-22 | Disposition: A | Discharge status: Home / Self Care | Payer: Medicaid Other | Source: Ambulatory Visit | Attending: Obstetrics | Admitting: Obstetrics

## 2017-01-22 VITALS — BP 124/82 | HR 97 | Wt 164.0 lb

## 2017-01-22 DIAGNOSIS — F3341 Major depressive disorder, recurrent, in partial remission: Secondary | ICD-10-CM

## 2017-01-22 DIAGNOSIS — N939 Abnormal uterine and vaginal bleeding, unspecified: Secondary | ICD-10-CM | POA: Diagnosis not present

## 2017-01-22 DIAGNOSIS — T8332XA Displacement of intrauterine contraceptive device, initial encounter: Secondary | ICD-10-CM

## 2017-01-22 DIAGNOSIS — N898 Other specified noninflammatory disorders of vagina: Secondary | ICD-10-CM | POA: Insufficient documentation

## 2017-01-22 MED ORDER — SERTRALINE HCL 50 MG PO TABS
50.0000 mg | ORAL_TABLET | Freq: Every day | ORAL | 11 refills | Status: DC
Start: 2017-01-22 — End: 2020-03-24

## 2017-01-22 MED ORDER — METRONIDAZOLE 500 MG PO TABS: 500.0000 mg | ORAL_TABLET | Freq: Two times a day (BID) | ORAL | 0 refills | Status: DC

## 2017-01-22 NOTE — Progress Notes (Signed)
Patient complains of bleeding since Mirena insertion 12/04/16 with malodor.

## 2017-01-23 LAB — CERVICOVAGINAL ANCILLARY ONLY
BACTERIAL VAGINITIS: NEGATIVE
CANDIDA VAGINITIS: NEGATIVE
CHLAMYDIA, DNA PROBE: NEGATIVE
Neisseria Gonorrhea: NEGATIVE
Trichomonas: NEGATIVE
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
## 2017-02-01 ENCOUNTER — Ambulatory Visit (HOSPITAL_COMMUNITY): Payer: Medicaid Other | Attending: Obstetrics

## 2017-02-13 ENCOUNTER — Ambulatory Visit: Payer: Medicaid Other | Admitting: Obstetrics & Gynecology

## 2017-03-05 ENCOUNTER — Ambulatory Visit: Payer: Medicaid Other

## 2017-03-25 ENCOUNTER — Ambulatory Visit (HOSPITAL_COMMUNITY)
Admission: RE | Admit: 2017-03-25 | Discharge: 2017-03-25 | Disposition: A | Discharge status: Home / Self Care | Payer: Medicaid Other | Source: Ambulatory Visit | Attending: Obstetrics | Admitting: Obstetrics

## 2017-03-25 DIAGNOSIS — N939 Abnormal uterine and vaginal bleeding, unspecified: Secondary | ICD-10-CM

## 2017-03-25 DIAGNOSIS — N8312 Corpus luteum cyst of left ovary: Secondary | ICD-10-CM | POA: Insufficient documentation

## 2017-03-25 DIAGNOSIS — T8332XA Displacement of intrauterine contraceptive device, initial encounter: Secondary | ICD-10-CM

## 2017-03-25 DIAGNOSIS — Z975 Presence of (intrauterine) contraceptive device: Secondary | ICD-10-CM | POA: Diagnosis not present

## 2017-04-04 ENCOUNTER — Telehealth: Payer: Self-pay

## 2017-04-04 NOTE — Telephone Encounter (Signed)
Pt called wanting to know her ultrasound results. Results reviewed with pt. U/S report states that IUD is in satisfactory position. Pt states that she is still bleeding, and she has completed her pills that she was prescribed. Pt would like to know what is the next step. She states that she is tired of bleeding, and wants to be sexually active.

## 2017-04-05 ENCOUNTER — Other Ambulatory Visit: Payer: Self-pay | Admitting: Obstetrics

## 2017-04-08 NOTE — Telephone Encounter (Signed)
Recommend taking a pack of OCP's.  Ortho Novum 1/35 prescribed.

## 2017-04-08 NOTE — Telephone Encounter (Signed)
Left message for pt to return call to office.

## 2017-07-04 ENCOUNTER — Ambulatory Visit (INDEPENDENT_AMBULATORY_CARE_PROVIDER_SITE_OTHER): Payer: Medicaid Other | Admitting: Obstetrics

## 2017-07-04 ENCOUNTER — Encounter: Payer: Self-pay | Admitting: Obstetrics

## 2017-07-04 VITALS — BP 135/88 | HR 71 | Wt 158.0 lb

## 2017-07-04 DIAGNOSIS — Z30432 Encounter for removal of intrauterine contraceptive device: Secondary | ICD-10-CM

## 2017-07-04 DIAGNOSIS — Z3009 Encounter for other general counseling and advice on contraception: Secondary | ICD-10-CM

## 2017-07-04 DIAGNOSIS — Z30011 Encounter for initial prescription of contraceptive pills: Secondary | ICD-10-CM

## 2017-07-04 DIAGNOSIS — F17200 Nicotine dependence, unspecified, uncomplicated: Secondary | ICD-10-CM

## 2017-07-04 MED ORDER — LO LOESTRIN FE 1 MG-10 MCG / 10 MCG PO TABS: 1.0000 | ORAL_TABLET | Freq: Every day | ORAL | 11 refills | Status: DC

## 2017-07-04 MED ORDER — VARENICLINE TARTRATE 0.5 MG X 11 & 1 MG X 42 PO MISC: ORAL | 0 refills | Status: DC

## 2017-07-04 MED ORDER — VARENICLINE TARTRATE 1 MG PO TABS: 1.0000 mg | ORAL_TABLET | Freq: Two times a day (BID) | ORAL | 1 refills | Status: DC

## 2017-07-04 NOTE — Progress Notes (Signed)
Patient is in the office to have her IUD removed. She has not stopped bleeding since insertion and would like it removed. She would like OCP instead.

## 2017-07-04 NOTE — Patient Instructions (Addendum)
Oral Contraception Information Oral contraceptive pills (OCPs) are medicines taken to prevent pregnancy. OCPs work by preventing the ovaries from releasing eggs. The hormones in OCPs also cause the cervical mucus to thicken, preventing the sperm from entering the uterus. The hormones also cause the uterine lining to become thin, not allowing a fertilized egg to attach to the inside of the uterus. OCPs are highly effective when taken exactly as prescribed. However, OCPs do not prevent sexually transmitted diseases (STDs). Safe sex practices, such as using condoms along with the pill, can help prevent STDs. Before taking the pill, you may have a physical exam and Pap test. Your health care provider may order blood tests. The health care provider will make sure you are a good candidate for oral contraception. Discuss with your health care provider the possible side effects of the OCP you may be prescribed. When starting an OCP, it can take 2 to 3 months for the body to adjust to the changes in hormone levels in your body. Types of oral contraception  The combination pill-This pill contains estrogen and progestin (synthetic progesterone) hormones. The combination pill comes in 21-day, 28-day, or 91-day packs. Some types of combination pills are meant to be taken continuously (365-day pills). With 21-day packs, you do not take pills for 7 days after the last pill. With 28-day packs, the pill is taken every day. The last 7 pills are without hormones. Certain types of pills have more than 21 hormone-containing pills. With 91-day packs, the first 84 pills contain both hormones, and the last 7 pills contain no hormones or contain estrogen only.  The minipill-This pill contains the progesterone hormone only. The pill is taken every day continuously. It is very important to take the pill at the same time each day. The minipill comes in packs of 28 pills. All 28 pills contain the hormone. Advantages of oral  contraceptive pills  Decreases premenstrual symptoms.  Treats menstrual period cramps.  Regulates the menstrual cycle.  Decreases a heavy menstrual flow.  May treatacne, depending on the type of pill.  Treats abnormal uterine bleeding.  Treats polycystic ovarian syndrome.  Treats endometriosis.  Can be used as emergency contraception. Things that can make oral contraceptive pills less effective OCPs can be less effective if:  You forget to take the pill at the same time every day.  You have a stomach or intestinal disease that lessens the absorption of the pill.  You take OCPs with other medicines that make OCPs less effective, such as antibiotics, certain HIV medicines, and some seizure medicines.  You take expired OCPs.  You forget to restart the pill on day 7, when using the packs of 21 pills.  Risks associated with oral contraceptive pills Oral contraceptive pills can sometimes cause side effects, such as:  Headache.  Nausea.  Breast tenderness.  Irregular bleeding or spotting.  Combination pills are also associated with a small increased risk of:  Blood clots.  Heart attack.  Stroke.  This information is not intended to replace advice given to you by your health care provider. Make sure you discuss any questions you have with your health care provider. Document Released: 12/29/2002 Document Revised: 03/15/2016 Document Reviewed: 03/29/2013 Elsevier Interactive Patient Education  2018 [employer]  Oral Contraception Use Oral contraceptive pills (OCPs) are medicines taken to prevent pregnancy. OCPs work by preventing the ovaries from releasing eggs. The hormones in OCPs also cause the cervical mucus to thicken, preventing the sperm from entering the uterus. The  hormones also cause the uterine lining to become thin, not allowing a fertilized egg to attach to the inside of the uterus. OCPs are highly effective when taken exactly as prescribed. However,  OCPs do not prevent sexually transmitted diseases (STDs). Safe sex practices, such as using condoms along with an OCP, can help prevent STDs. Before taking OCPs, you may have a physical exam and Pap test. Your health care provider may also order blood tests if necessary. Your health care provider will make sure you are a good candidate for oral contraception. Discuss with your health care provider the possible side effects of the OCP you may be prescribed. When starting an OCP, it can take 2 to 3 months for the body to adjust to the changes in hormone levels in your body. How to take oral contraceptive pills Your health care provider may advise you on how to start taking the first cycle of OCPs. Otherwise, you can:  Start on day 1 of your menstrual period. You will not need any backup contraceptive protection with this start time.  Start on the first Sunday after your menstrual period or the day you get your prescription. In these cases, you will need to use backup contraceptive protection for the first week.  Start the pill at any time of your cycle. If you take the pill within 5 days of the start of your period, you are protected against pregnancy right away. In this case, you will not need a backup form of birth control. If you start at any other time of your menstrual cycle, you will need to use another form of birth control for 7 days. If your OCP is the type called a minipill, it will protect you from pregnancy after taking it for 2 days (48 hours).  After you have started taking OCPs:  If you forget to take 1 pill, take it as soon as you remember. Take the next pill at the regular time.  If you miss 2 or more pills, call your health care provider because different pills have different instructions for missed doses. Use backup birth control until your next menstrual period starts.  If you use a 28-day pack that contains inactive pills and you miss 1 of the last 7 pills (pills with no  hormones), it will not matter. Throw away the rest of the non-hormone pills and start a new pill pack.  No matter which day you start the OCP, you will always start a new pack on that same day of the week. Have an extra pack of OCPs and a backup contraceptive method available in case you miss some pills or lose your OCP pack. Follow these instructions at home:  Do not smoke.  Always use a condom to protect against STDs. OCPs do not protect against STDs.  Use a calendar to mark your menstrual period days.  Read the information and directions that came with your OCP. Talk to your health care provider if you have questions. Contact a health care provider if:  You develop nausea and vomiting.  You have abnormal vaginal discharge or bleeding.  You develop a rash.  You miss your menstrual period.  You are losing your hair.  You need treatment for mood swings or depression.  You get dizzy when taking the OCP.  You develop acne from taking the OCP.  You become pregnant. Get help right away if:  You develop chest pain.  You develop shortness of breath.  You have an uncontrolled or  severe headache.  You develop numbness or slurred speech.  You develop visual problems.  You develop pain, redness, and swelling in the legs. This information is not intended to replace advice given to you by your health care provider. Make sure you discuss any questions you have with your health care provider. Document Released: 09/27/2011 Document Revised: 03/15/2016 Document Reviewed: 03/29/2013 Elsevier Interactive Patient Education  2017 [employer]  Smoking Tobacco Information Smoking tobacco will very likely harm your health. Tobacco contains a poisonous (toxic), colorless chemical called nicotine. Nicotine affects the brain and makes tobacco addictive. This change in your brain can make it hard to stop smoking. Tobacco also has other toxic chemicals that can hurt your body and raise your  risk of many cancers. How can smoking tobacco affect me? Smoking tobacco can increase your chances of having serious health conditions, such as:  Cancer. Smoking is most commonly associated with lung cancer, but can lead to cancer in other parts of the body.  Chronic obstructive pulmonary disease (COPD). This is a long-term lung condition that makes it hard to breathe. It also gets worse over time.  High blood pressure (hypertension), heart disease, stroke, or heart attack.  Lung infections, such as pneumonia.  Cataracts. This is when the lenses in the eyes become clouded.  Digestive problems. This may include peptic ulcers, heartburn, and gastroesophageal reflux disease (GERD).  Oral health problems, such as gum disease and tooth loss.  Loss of taste and smell.  Smoking can affect your appearance by causing:  Wrinkles.  Yellow or stained teeth, fingers, and fingernails.  Smoking tobacco can also affect your social life.  Many workplaces, [employer], hotels, and public places are tobacco-free. This means that you may experience challenges in finding places to smoke when away from home.  The cost of a smoking habit can be expensive. Expenses for someone who smokes come in two ways: ? You spend money on a regular basis to buy tobacco. ? Your health care costs in the long-term are higher if you smoke.  Tobacco smoke can also affect the health of those around you. Children of smokers have greater chances of: ? Sudden infant death syndrome (SIDS). ? Ear infections. ? Lung infections.  What lifestyle changes can be made?  Do not start smoking. Quit if you already do.  To quit smoking: ? Make a plan to quit smoking and commit yourself to it. Look for programs to help you and ask your health care provider for recommendations and ideas. ? Talk with your health care provider about using nicotine replacement medicines to help you quit. Medicine replacement medicines include gum,  lozenges, patches, sprays, or pills. ? Do not replace cigarette smoking with electronic cigarettes, which are commonly called e-cigarettes. The safety of e-cigarettes is not known, and some may contain harmful chemicals. ? Avoid places, people, or situations that tempt you to smoke. ? If you try to quit but return to smoking, don't give up hope. It is very common for people to try a number of times before they fully succeed. When you feel ready again, give it another try.  Quitting smoking might affect the way you eat as well as your weight. Be prepared to monitor your eating habits. Get support in planning and following a healthy diet.  Ask your health care provider about having regular tests (screenings) to check for cancer. This may include blood tests, imaging tests, and other tests.  Exercise regularly. Consider taking walks, joining a gym, or doing  yoga or exercise classes.  Develop skills to manage your stress. These skills include meditation. What are the benefits of quitting smoking? By quitting smoking, you may:  Lower your risk of getting cancer and other diseases caused by smoking.  Live longer.  Breathe better.  Lower your blood pressure and heart rate.  Stop your addiction to tobacco.  Stop creating secondhand smoke that hurts other people.  Improve your sense of taste and smell.  Look better over time, due to having fewer wrinkles and less staining.  What can happen if changes are not made? If you do not stop smoking, you may:  Get cancer and other diseases.  Develop COPD or other long-term (chronic) lung conditions.  Develop serious problems with your heart and blood vessels (cardiovascular system).  Need more tests to screen for problems caused by smoking.  Have higher, long-term healthcare costs from medicines or treatments related to smoking.  Continue to have worsening changes in your lungs, mouth, and nose.  Where to find support: To get support to  quit smoking, consider:  Asking your health care provider for more information and resources.  Taking classes to learn more about quitting smoking.  Looking for local organizations that offer resources about quitting smoking.  Joining a support group for people who want to quit smoking in your local community.  Where to find more information: You may find more information about quitting smoking from:  HelpGuide.org: www.helpguide.org/articles/addictions/how-to-quit-smoking.htm  BankRights[employer]: smokefree.gov  American Lung Association: www.lung.org  Contact a health care provider if:  You have problems breathing.  Your lips, nose, or fingers turn blue.  You have chest pain.  You are coughing up blood.  You feel faint or you pass out.  You have other noticeable changes that cause you to worry. Summary  Smoking tobacco can negatively affect your health, the health of those around you, your finances, and your social life.  Do not start smoking. Quit if you already do. If you need help quitting, ask your health care provider.  Think about joining a support group for people who want to quit smoking in your local community. There are many effective programs that will help you to quit this behavior. This information is not intended to replace advice given to you by your health care provider. Make sure you discuss any questions you have with your health care provider. Document Released: 10/23/2016 Document Revised: 10/23/2016 Document Reviewed: 10/23/2016 Elsevier Interactive Patient Education  [employer].

## 2017-07-04 NOTE — Progress Notes (Signed)
    GYNECOLOGY OFFICE PROCEDURE NOTE  Julia [name] is a 36 y.o. [id number] here for Mirena IUD removal. Has had excessive vaginal bleeding since insertion.  Last pap smear was in June 2017 and was LGSIL.  IUD Removal  Patient identified, informed consent performed, consent signed.  Patient was in the dorsal lithotomy position, normal external genitalia was noted.  A speculum was placed in the patient's vagina, normal discharge was noted, no lesions. The cervix was visualized, no lesions, no abnormal discharge.  The strings of the IUD were grasped and pulled using ring forceps. The IUD was removed in its entirety.   Patient tolerated the procedure well.    Patient will use OCP's for contraception.  Routine preventative health maintenance measures emphasized.   Brock BadHARLES A. HARPER, MD, FACOG Attending Obstetrician & Gynecologist, [facility], [facility]

## 2017-09-10 ENCOUNTER — Other Ambulatory Visit: Payer: Self-pay | Admitting: Obstetrics

## 2017-09-10 DIAGNOSIS — N898 Other specified noninflammatory disorders of vagina: Secondary | ICD-10-CM

## 2017-12-24 ENCOUNTER — Other Ambulatory Visit: Payer: Self-pay | Admitting: Obstetrics

## 2017-12-24 DIAGNOSIS — N898 Other specified noninflammatory disorders of vagina: Secondary | ICD-10-CM

## 2018-03-02 ENCOUNTER — Other Ambulatory Visit: Payer: Self-pay

## 2018-03-02 ENCOUNTER — Encounter (HOSPITAL_BASED_OUTPATIENT_CLINIC_OR_DEPARTMENT_OTHER): Payer: Self-pay | Admitting: Emergency Medicine

## 2018-03-02 ENCOUNTER — Emergency Department (HOSPITAL_BASED_OUTPATIENT_CLINIC_OR_DEPARTMENT_OTHER)
Admission: EM | Admit: 2018-03-02 | Discharge: 2018-03-02 | Disposition: A | Discharge status: Home / Self Care | Payer: Medicaid Other | Attending: Emergency Medicine | Admitting: Emergency Medicine

## 2018-03-02 DIAGNOSIS — H9202 Otalgia, left ear: Secondary | ICD-10-CM | POA: Diagnosis not present

## 2018-03-02 DIAGNOSIS — F17210 Nicotine dependence, cigarettes, uncomplicated: Secondary | ICD-10-CM | POA: Diagnosis not present

## 2018-03-02 DIAGNOSIS — R0981 Nasal congestion: Secondary | ICD-10-CM | POA: Diagnosis not present

## 2018-03-02 DIAGNOSIS — J019 Acute sinusitis, unspecified: Secondary | ICD-10-CM | POA: Diagnosis not present

## 2018-03-02 DIAGNOSIS — Z79899 Other long term (current) drug therapy: Secondary | ICD-10-CM | POA: Diagnosis not present

## 2018-03-02 DIAGNOSIS — R51 Headache: Secondary | ICD-10-CM | POA: Insufficient documentation

## 2018-03-02 MED ORDER — AMOXICILLIN 500 MG PO CAPS: 500.0000 mg | ORAL_CAPSULE | Freq: Three times a day (TID) | ORAL | 0 refills | Status: DC

## 2018-03-02 MED ORDER — CETIRIZINE-PSEUDOEPHEDRINE ER 5-120 MG PO TB12: 1.0000 | ORAL_TABLET | Freq: Every day | ORAL | 0 refills | Status: DC

## 2018-03-02 MED ORDER — OXYMETAZOLINE HCL 0.05 % NA SOLN
1.0000 | Freq: Once | NASAL | Status: AC
  Administered 2018-03-02: 1 via NASAL
  Filled 2018-03-02: qty 15
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
## 2018-03-02 NOTE — Discharge Instructions (Signed)
Use the Afrin nasal spray 2 times a day for the next 3 days and then stop.  Use the allergy medicine for 1 month and then stop to see if you need it anymore.  If you continue to have congestion you can also use Ocean nasal spray or saline spray

## 2018-03-02 NOTE — ED Triage Notes (Signed)
L ear pain since 3 this morning.

## 2018-08-12 ENCOUNTER — Ambulatory Visit: Payer: Medicaid Other | Admitting: Obstetrics

## 2020-02-09 ENCOUNTER — Ambulatory Visit: Payer: Medicaid Other | Admitting: Obstetrics

## 2020-02-23 ENCOUNTER — Ambulatory Visit: Payer: Medicaid Other | Admitting: Obstetrics

## 2020-03-15 ENCOUNTER — Encounter: Payer: Self-pay | Admitting: Obstetrics

## 2020-03-15 ENCOUNTER — Ambulatory Visit (INDEPENDENT_AMBULATORY_CARE_PROVIDER_SITE_OTHER): Payer: 59 | Admitting: Obstetrics

## 2020-03-15 ENCOUNTER — Other Ambulatory Visit: Payer: Self-pay

## 2020-03-15 ENCOUNTER — Other Ambulatory Visit (HOSPITAL_COMMUNITY)
Admission: RE | Admit: 2020-03-15 | Discharge: 2020-03-15 | Disposition: A | Discharge status: Home / Self Care | Payer: 59 | Source: Ambulatory Visit | Attending: Obstetrics | Admitting: Obstetrics

## 2020-03-15 VITALS — BP 138/99 | HR 83 | Ht 63.0 in | Wt 156.8 lb

## 2020-03-15 DIAGNOSIS — N898 Other specified noninflammatory disorders of vagina: Secondary | ICD-10-CM | POA: Insufficient documentation

## 2020-03-15 DIAGNOSIS — E569 Vitamin deficiency, unspecified: Secondary | ICD-10-CM

## 2020-03-15 DIAGNOSIS — Z0000 Encounter for general adult medical examination without abnormal findings: Secondary | ICD-10-CM

## 2020-03-15 DIAGNOSIS — Z113 Encounter for screening for infections with a predominantly sexual mode of transmission: Secondary | ICD-10-CM | POA: Diagnosis not present

## 2020-03-15 DIAGNOSIS — F320 Major depressive disorder, single episode, mild: Secondary | ICD-10-CM

## 2020-03-15 DIAGNOSIS — Z01419 Encounter for gynecological examination (general) (routine) without abnormal findings: Secondary | ICD-10-CM | POA: Diagnosis present

## 2020-03-15 DIAGNOSIS — F17200 Nicotine dependence, unspecified, uncomplicated: Secondary | ICD-10-CM

## 2020-03-15 MED ORDER — PRENATABS RX 29-1 MG PO TABS: 1.0000 | ORAL_TABLET | Freq: Every day | ORAL | 11 refills | Status: DC

## 2020-03-15 MED ORDER — CHANTIX STARTING MONTH PAK 0.5 MG X 11 & 1 MG X 42 PO TABS: ORAL_TABLET | ORAL | 0 refills | Status: DC

## 2020-03-15 MED ORDER — VARENICLINE TARTRATE 1 MG PO TABS: 1.0000 mg | ORAL_TABLET | Freq: Two times a day (BID) | ORAL | 1 refills | Status: DC

## 2020-03-15 NOTE — Addendum Note (Signed)
Addended by: Coral Ceo A on: 03/15/2020 10:53 AM   Modules accepted: Orders

## 2020-03-15 NOTE — Progress Notes (Signed)
Pt presents for annual, pap, and all STD testing.  GAD = 15
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
## 2020-03-16 LAB — CERVICOVAGINAL ANCILLARY ONLY
Bacterial Vaginitis (gardnerella): POSITIVE — AB
Candida Glabrata: NEGATIVE
Candida Vaginitis: NEGATIVE
Chlamydia: NEGATIVE
Comment: NEGATIVE
Comment: NEGATIVE
Comment: NEGATIVE
Comment: NEGATIVE
Comment: NEGATIVE
Comment: NORMAL
Neisseria Gonorrhea: NEGATIVE
Trichomonas: NEGATIVE

## 2020-03-16 LAB — HIV ANTIBODY (ROUTINE TESTING W REFLEX): HIV Screen 4th Generation wRfx: NONREACTIVE

## 2020-03-16 LAB — HEPATITIS B SURFACE ANTIGEN: Hepatitis B Surface Ag: NEGATIVE

## 2020-03-16 LAB — HEPATITIS C ANTIBODY: Hep C Virus Ab: <0.1 s/co ratio (ref 0.0–0.9)

## 2020-03-16 LAB — RPR: RPR Ser Ql: NONREACTIVE

## 2020-03-17 ENCOUNTER — Other Ambulatory Visit: Payer: Self-pay | Admitting: Obstetrics

## 2020-03-17 DIAGNOSIS — N760 Acute vaginitis: Secondary | ICD-10-CM

## 2020-03-17 MED ORDER — TINIDAZOLE 500 MG PO TABS: 1000.0000 mg | ORAL_TABLET | Freq: Every day | ORAL | 2 refills | Status: DC

## 2020-03-22 LAB — CYTOLOGY - PAP
Comment: NEGATIVE
Comment: NEGATIVE
Comment: NEGATIVE
Diagnosis: UNDETERMINED — AB
HPV 16: NEGATIVE
HPV 18 / 45: NEGATIVE
High risk HPV: POSITIVE — AB

## 2020-03-24 ENCOUNTER — Other Ambulatory Visit: Payer: Self-pay | Admitting: Obstetrics

## 2020-03-24 ENCOUNTER — Telehealth: Payer: Self-pay

## 2020-03-24 DIAGNOSIS — F3341 Major depressive disorder, recurrent, in partial remission: Secondary | ICD-10-CM

## 2020-03-24 MED ORDER — SERTRALINE HCL 50 MG PO TABS: 50.0000 mg | ORAL_TABLET | Freq: Every day | ORAL | 11 refills | Status: DC

## 2020-03-24 NOTE — Telephone Encounter (Signed)
Pt called office a second time regarding Zoloft Rx .  Pt made aware it was received and ready for pick up  Pt had also requesting Rx Seroquel to help sleep pt states she recently traveled and is tired and has not been able to sleep Rx  denied by Dr.Harper pt will need to discuss sleep aid with PCP . Per Dr.Harper zoloft Rx cannot be managed by him anymore after refills she will need to f/u with pcp for management of zoloft sent as courteous Rx for pt. Per Dr.Harper.

## 2020-04-11 ENCOUNTER — Encounter: Payer: 59 | Admitting: Obstetrics

## 2020-06-10 ENCOUNTER — Ambulatory Visit
Admission: RE | Admit: 2020-06-10 | Discharge: 2020-06-10 | Discharge status: Absent without leave | Payer: 59 | Source: Ambulatory Visit

## 2020-06-10 ENCOUNTER — Ambulatory Visit
Admission: EM | Admit: 2020-06-10 | Discharge: 2020-06-10 | Disposition: A | Discharge status: Home / Self Care | Payer: 59 | Attending: Physician Assistant | Admitting: Physician Assistant

## 2020-06-10 ENCOUNTER — Other Ambulatory Visit: Payer: Self-pay

## 2020-06-10 DIAGNOSIS — R1011 Right upper quadrant pain: Secondary | ICD-10-CM

## 2020-06-10 DIAGNOSIS — R1013 Epigastric pain: Secondary | ICD-10-CM | POA: Diagnosis not present

## 2020-06-10 LAB — CBC WITH DIFFERENTIAL/PLATELET
Basophils Absolute: 0 x10E3/uL (ref 0.0–0.2)
Basos: 0 %
EOS (ABSOLUTE): 0.1 x10E3/uL (ref 0.0–0.4)
Eos: 1 %
Hematocrit: 38.7 % (ref 34.0–46.6)
Hemoglobin: 13.2 g/dL (ref 11.1–15.9)
Lymphocytes Absolute: 1.4 x10E3/uL (ref 0.7–3.1)
Lymphs: 19 %
MCH: 31.1 pg (ref 26.6–33.0)
MCHC: 34.1 g/dL (ref 31.5–35.7)
MCV: 91 fL (ref 79–97)
Monocytes Absolute: 0.7 x10E3/uL (ref 0.1–0.9)
Monocytes: 10 %
Neutrophils Absolute: 5.3 x10E3/uL (ref 1.4–7.0)
Neutrophils: 70 %
Platelets: 258 x10E3/uL (ref 150–450)
RBC: 4.24 x10E6/uL (ref 3.77–5.28)
RDW: 12.9 % (ref 11.7–15.4)
WBC: 7.5 x10E3/uL (ref 3.4–10.8)

## 2020-06-10 LAB — COMPREHENSIVE METABOLIC PANEL
ALT: 17 IU/L (ref 0–32)
AST: 21 IU/L (ref 0–40)
Albumin/Globulin Ratio: 1.6 (ref 1.2–2.2)
Albumin: 4.3 g/dL (ref 3.8–4.8)
Alkaline Phosphatase: 124 IU/L — ABNORMAL HIGH (ref 48–121)
BUN/Creatinine Ratio: 8 — ABNORMAL LOW (ref 9–23)
BUN: 4 mg/dL — ABNORMAL LOW (ref 6–20)
Bilirubin Total: 0.5 mg/dL (ref 0.0–1.2)
CO2: 28 mmol/L (ref 20–29)
Calcium: 9.4 mg/dL (ref 8.7–10.2)
Chloride: 98 mmol/L (ref 96–106)
Creatinine, Ser: 0.53 mg/dL — ABNORMAL LOW (ref 0.57–1.00)
GFR calc Af Amer: 139 mL/min/{1.73_m2} (ref >59)
GFR calc non Af Amer: 121 mL/min/{1.73_m2} (ref >59)
Globulin, Total: 2.7 g/dL (ref 1.5–4.5)
Glucose: 92 mg/dL (ref 65–99)
Potassium: 3.7 mmol/L (ref 3.5–5.2)
Sodium: 137 mmol/L (ref 134–144)
Total Protein: 7 g/dL (ref 6.0–8.5)

## 2020-06-10 LAB — LIPASE: Lipase: 15 U/L (ref 14–72)

## 2020-06-10 MED ORDER — ONDANSETRON 4 MG PO TBDP: 4.0000 mg | ORAL_TABLET | Freq: Three times a day (TID) | ORAL | 0 refills | Status: DC | PRN

## 2020-06-10 MED ORDER — ESOMEPRAZOLE MAGNESIUM 20 MG PO CPDR: 20.0000 mg | DELAYED_RELEASE_CAPSULE | Freq: Every day | ORAL | 0 refills | Status: DC

## 2020-06-10 NOTE — ED Triage Notes (Signed)
Pt states she is an alcoholic and had been beng drinking. States on Wednesday morning she was vomiting bright yellow and has been having RUQ pain since. States has a large bruise to lt upper arm x1 week that just popped up.

## 2020-06-10 NOTE — Discharge Instructions (Addendum)
Lab drawn to check for your blood cell count, liver function, kidney function, pancreatic enzyme. Start  zofran for nausea/vomiting as needed. Nexium for possible stomach upset causing symptoms. Keep hydrated, you urine should be clear to pale yellow in color. Bland diet, advance as tolerated. Monitor for any worsening of symptoms, nausea or vomiting not controlled by medication, worsening abdominal pain, fever, go to the emergency department for further evaluation needed.
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
## 2020-06-14 ENCOUNTER — Other Ambulatory Visit: Payer: Self-pay

## 2020-06-14 ENCOUNTER — Ambulatory Visit
Admission: EM | Admit: 2020-06-14 | Discharge: 2020-06-14 | Disposition: A | Discharge status: Home / Self Care | Payer: 59 | Attending: Emergency Medicine | Admitting: Emergency Medicine

## 2020-06-14 ENCOUNTER — Ambulatory Visit: Admit: 2020-06-14 | Discharge status: Absent without leave | Payer: Self-pay

## 2020-06-14 DIAGNOSIS — K0889 Other specified disorders of teeth and supporting structures: Secondary | ICD-10-CM | POA: Diagnosis not present

## 2020-06-14 MED ORDER — NAPROXEN 250 MG PO TABS: 500.0000 mg | ORAL_TABLET | Freq: Two times a day (BID) | ORAL | 0 refills | Status: DC

## 2020-06-14 MED ORDER — AMOXICILLIN-POT CLAVULANATE 875-125 MG PO TABS: 1.0000 | ORAL_TABLET | Freq: Two times a day (BID) | ORAL | 0 refills | Status: DC
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
## 2020-06-14 NOTE — Discharge Instructions (Addendum)
Low-Cost Community Dental Resources:  [facility] Address: [address], [place], [place], [address] Phone: [phone]  - Dr. Civils Address: [address], [place], [place], [address] Phone: [phone] 

## 2020-06-27 ENCOUNTER — Other Ambulatory Visit: Payer: Self-pay

## 2020-06-27 ENCOUNTER — Emergency Department (HOSPITAL_BASED_OUTPATIENT_CLINIC_OR_DEPARTMENT_OTHER)
Admission: EM | Admit: 2020-06-27 | Discharge: 2020-06-27 | Disposition: A | Discharge status: Home / Self Care | Payer: 59 | Attending: Emergency Medicine | Admitting: Emergency Medicine

## 2020-06-27 ENCOUNTER — Encounter (HOSPITAL_BASED_OUTPATIENT_CLINIC_OR_DEPARTMENT_OTHER): Payer: Self-pay | Admitting: *Deleted

## 2020-06-27 DIAGNOSIS — K0889 Other specified disorders of teeth and supporting structures: Secondary | ICD-10-CM | POA: Diagnosis present

## 2020-06-27 DIAGNOSIS — K047 Periapical abscess without sinus: Secondary | ICD-10-CM | POA: Diagnosis not present

## 2020-06-27 DIAGNOSIS — F17210 Nicotine dependence, cigarettes, uncomplicated: Secondary | ICD-10-CM | POA: Diagnosis not present

## 2020-06-27 MED ORDER — CLINDAMYCIN HCL 150 MG PO CAPS: 450.0000 mg | ORAL_CAPSULE | Freq: Three times a day (TID) | ORAL | 0 refills | Status: AC

## 2020-06-27 MED ORDER — KETOROLAC TROMETHAMINE 30 MG/ML IJ SOLN
30.0000 mg | Freq: Once | INTRAMUSCULAR | Status: AC
  Administered 2020-06-27: 30 mg via INTRAMUSCULAR

## 2020-06-27 MED ORDER — KETOROLAC TROMETHAMINE 30 MG/ML IJ SOLN
30.0000 mg | Freq: Once | INTRAMUSCULAR | Status: DC
  Filled 2020-06-27: qty 1

## 2020-06-27 MED ORDER — OXYCODONE-ACETAMINOPHEN 5-325 MG PO TABS: 1.0000 | ORAL_TABLET | Freq: Three times a day (TID) | ORAL | 0 refills | Status: DC | PRN

## 2020-06-27 NOTE — Discharge Instructions (Addendum)
You are seen today for dental infection, as we discussed you need to get that tooth pulled.  Please use the dental resource guide and make an appointment tomorrow morning.  I prescribed you clindamycin, this is stronger than your other antibiotic.  Please talk to your pharmacist about any interactions with other medications and side effects of this medication.  If you start having severe side effects please stop taking it.  There is a risk of C. difficile when taking this medication that you should be aware about.  If you have any new or worsening concerning symptoms please come back to the emergency department.  As you spoke about please take Tylenol for the pain, you can take the Percocet for breakthrough pain.  Do note that this is a narcotic, do not drive when taking this medication.  Also be aware that there is Tylenol in Percocet, do not take more than 4 g of Tylenol in a day.

## 2020-06-27 NOTE — ED Triage Notes (Signed)
C/o dental pain x 1 month. 
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
## 2020-06-29 ENCOUNTER — Emergency Department (HOSPITAL_COMMUNITY): Payer: 59

## 2020-06-29 ENCOUNTER — Emergency Department (HOSPITAL_BASED_OUTPATIENT_CLINIC_OR_DEPARTMENT_OTHER)
Admission: EM | Admit: 2020-06-29 | Discharge: 2020-06-29 | Disposition: A | Discharge status: Home / Self Care | Payer: 59 | Attending: Emergency Medicine | Admitting: Emergency Medicine

## 2020-06-29 ENCOUNTER — Emergency Department (HOSPITAL_COMMUNITY): Admission: EM | Admit: 2020-06-29 | Discharge: 2020-06-29 | Discharge status: Against Medical Advice | Payer: 59

## 2020-06-29 ENCOUNTER — Encounter (HOSPITAL_BASED_OUTPATIENT_CLINIC_OR_DEPARTMENT_OTHER): Payer: Self-pay | Admitting: Emergency Medicine

## 2020-06-29 ENCOUNTER — Other Ambulatory Visit: Payer: Self-pay

## 2020-06-29 ENCOUNTER — Emergency Department (HOSPITAL_COMMUNITY)
Admission: EM | Admit: 2020-06-29 | Discharge: 2020-06-29 | Disposition: A | Discharge status: Home / Self Care | Payer: 59 | Source: Home / Self Care | Attending: Emergency Medicine | Admitting: Emergency Medicine

## 2020-06-29 ENCOUNTER — Encounter (HOSPITAL_COMMUNITY): Payer: Self-pay | Admitting: *Deleted

## 2020-06-29 DIAGNOSIS — R220 Localized swelling, mass and lump, head: Secondary | ICD-10-CM | POA: Diagnosis present

## 2020-06-29 DIAGNOSIS — F17210 Nicotine dependence, cigarettes, uncomplicated: Secondary | ICD-10-CM | POA: Diagnosis not present

## 2020-06-29 DIAGNOSIS — Z79899 Other long term (current) drug therapy: Secondary | ICD-10-CM | POA: Insufficient documentation

## 2020-06-29 DIAGNOSIS — K047 Periapical abscess without sinus: Secondary | ICD-10-CM | POA: Insufficient documentation

## 2020-06-29 DIAGNOSIS — L03211 Cellulitis of face: Secondary | ICD-10-CM

## 2020-06-29 DIAGNOSIS — M272 Inflammatory conditions of jaws: Secondary | ICD-10-CM

## 2020-06-29 LAB — CBC WITH DIFFERENTIAL/PLATELET
Abs Immature Granulocytes: 0.02 10*3/uL (ref 0.00–0.07)
Basophils Absolute: 0 10*3/uL (ref 0.0–0.1)
Basophils Relative: 0 %
Eosinophils Absolute: 0.2 10*3/uL (ref 0.0–0.5)
Eosinophils Relative: 2 %
HCT: 40 % (ref 36.0–46.0)
Hemoglobin: 13.2 g/dL (ref 12.0–15.0)
Immature Granulocytes: 0 %
Lymphocytes Relative: 24 %
Lymphs Abs: 2.2 10*3/uL (ref 0.7–4.0)
MCH: 30.7 pg (ref 26.0–34.0)
MCHC: 33 g/dL (ref 30.0–36.0)
MCV: 93 fL (ref 80.0–100.0)
Monocytes Absolute: 0.7 10*3/uL (ref 0.1–1.0)
Monocytes Relative: 8 %
Neutro Abs: 6.1 10*3/uL (ref 1.7–7.7)
Neutrophils Relative %: 66 %
Platelets: 343 10*3/uL (ref 150–400)
RBC: 4.3 MIL/uL (ref 3.87–5.11)
RDW: 13.5 % (ref 11.5–15.5)
WBC: 9.2 10*3/uL (ref 4.0–10.5)
nRBC: 0 % (ref 0.0–0.2)

## 2020-06-29 LAB — COMPREHENSIVE METABOLIC PANEL
ALT: 19 U/L (ref 0–44)
AST: 21 U/L (ref 15–41)
Albumin: 4.6 g/dL (ref 3.5–5.0)
Alkaline Phosphatase: 75 U/L (ref 38–126)
Anion gap: 13 (ref 5–15)
BUN: 11 mg/dL (ref 6–20)
CO2: 22 mmol/L (ref 22–32)
Calcium: 9.3 mg/dL (ref 8.9–10.3)
Chloride: 101 mmol/L (ref 98–111)
Creatinine, Ser: 0.57 mg/dL (ref 0.44–1.00)
GFR calc Af Amer: >60 mL/min (ref >60)
GFR calc non Af Amer: >60 mL/min (ref >60)
Glucose, Bld: 94 mg/dL (ref 70–99)
Potassium: 4.7 mmol/L (ref 3.5–5.1)
Sodium: 136 mmol/L (ref 135–145)
Total Bilirubin: 0.9 mg/dL (ref 0.3–1.2)
Total Protein: 7.9 g/dL (ref 6.5–8.1)

## 2020-06-29 LAB — I-STAT BETA HCG BLOOD, ED (MC, WL, AP ONLY): I-stat hCG, quantitative: <5 m[IU]/mL (ref <5)

## 2020-06-29 MED ORDER — SODIUM CHLORIDE 0.9 % IV BOLUS
500.0000 mL | Freq: Once | INTRAVENOUS | Status: AC
  Administered 2020-06-29: 500 mL via INTRAVENOUS

## 2020-06-29 MED ORDER — IBUPROFEN 600 MG PO TABS: 600.0000 mg | ORAL_TABLET | Freq: Four times a day (QID) | ORAL | 0 refills | Status: DC | PRN

## 2020-06-29 MED ORDER — CLINDAMYCIN PHOSPHATE 600 MG/50ML IV SOLN
600.0000 mg | Freq: Once | INTRAVENOUS | Status: AC
  Administered 2020-06-29: 600 mg via INTRAVENOUS
  Filled 2020-06-29: qty 50

## 2020-06-29 MED ORDER — KETOROLAC TROMETHAMINE 30 MG/ML IJ SOLN
30.0000 mg | Freq: Once | INTRAMUSCULAR | Status: AC
  Administered 2020-06-29: 30 mg via INTRAVENOUS
  Filled 2020-06-29: qty 1

## 2020-06-29 MED ORDER — IOHEXOL 300 MG/ML  SOLN
75.0000 mL | Freq: Once | INTRAMUSCULAR | Status: AC | PRN
  Administered 2020-06-29: 75 mL via INTRAVENOUS

## 2020-06-29 MED ORDER — HYDROCODONE-ACETAMINOPHEN 5-325 MG PO TABS: 1.0000 | ORAL_TABLET | Freq: Four times a day (QID) | ORAL | 0 refills | Status: DC | PRN

## 2020-06-29 MED ORDER — HYDROMORPHONE HCL 1 MG/ML IJ SOLN
0.5000 mg | Freq: Once | INTRAMUSCULAR | Status: AC
  Administered 2020-06-29: 0.5 mg via INTRAVENOUS
  Filled 2020-06-29: qty 1

## 2020-06-29 NOTE — ED Triage Notes (Signed)
Pt presents with c/o right lower jaw swelling since yesterday.

## 2020-06-29 NOTE — ED Notes (Signed)
Called for room no answer

## 2020-06-29 NOTE — ED Triage Notes (Signed)
Pt with large amount of swelling to rt jaw area from ? Tooth abscess. Went to [facility] was not seen then called dentist who sent her here.

## 2020-06-29 NOTE — ED Provider Notes (Addendum)
Received patient as a handoff at shift change from [facility], PA-C.  In short, patient is a 39 year old female with no significant past medical history who presented to the ED with continued complaints of dental pain.  Patient has already been evaluated in urgent care and another time previously here in the ED.  She had been prescribed Augmentin and subsequently placed on clindamycin.  Over the course of the past 24 hours, her symptoms have worsened and she has noticed a severe swelling involving her right mandibular area with associated sore throat symptoms and increased difficulty tolerating secretions.  She saw a dentist this morning who referred to the ED for further evaluation.  Physical Exam  BP (!) 182/111 (BP Location: Right Arm)   Pulse 65   Temp 98.8 F (37.1 C) (Oral)   Resp 16   Ht 5\' 4"  (1.626 m)   Wt 72.6 kg   LMP 06/03/2020   SpO2 100%   BMI 27.46 kg/m   Physical Exam Vitals and nursing note reviewed. Exam conducted with a chaperone present.  Constitutional:      General: She is not in acute distress.    Appearance: She is ill-appearing.  HENT:     Head: Atraumatic.     Comments: Significant right-sided mandibular swelling with associated TTP.    Mouth/Throat:     Comments: 2 finger trismus.  Poor dentition noted throughout.  Erythema and significant inflammation surrounding #31 and #30 teeth.  Extensive caries.  Uvula appears midline.  Patent oropharynx.  Tolerating secretions.  Floor of mouth soft.  No tongue swelling.  Halitosis.  Eyes:     General: No scleral icterus.    Conjunctiva/sclera: Conjunctivae normal.  Neck:     Comments: No meningismus.  Cardiovascular:     Rate and Rhythm: Normal rate and regular rhythm.  Pulmonary:     Comments: No increased respiratory effort.  No tachypnea.  No stridor or wheezing noted. Musculoskeletal:     Cervical back: Normal range of motion and neck supple. No rigidity.  Skin:    General: Skin is dry.  Neurological:      Mental Status: She is alert.     GCS: GCS eye subscore is 4. GCS verbal subscore is 5. GCS motor subscore is 6.  Psychiatric:        Mood and Affect: Mood normal.        Behavior: Behavior normal.        Thought Content: Thought content normal.          ED Course/Procedures   Clinical Course as of Jun 29 1726  Wed Jun 29, 2020  1459 I-stat hCG, quantitative: <5.0 [LJ]  1500 No leukocytosis  WBC: 9.2 [LJ]  1500 Afebrile  Temp: 98.8 F (37.1 C) [LJ]    Clinical Course User Index [LJ] Jul 01, 2020, PA-C    Procedures Results for orders placed or performed during the hospital encounter of 06/29/20  Comprehensive metabolic panel  Result Value Ref Range   Sodium 136 135 - 145 mmol/L   Potassium 4.7 3.5 - 5.1 mmol/L   Chloride 101 98 - 111 mmol/L   CO2 22 22 - 32 mmol/L   Glucose, Bld 94 70 - 99 mg/dL   BUN 11 6 - 20 mg/dL   Creatinine, Ser 08/29/20 0.44 - 1.00 mg/dL   Calcium 9.3 8.9 - 2.70 mg/dL   Total Protein 7.9 6.5 - 8.1 g/dL   Albumin 4.6 3.5 - 5.0 g/dL   AST 21 15 -  41 U/L   ALT 19 0 - 44 U/L   Alkaline Phosphatase 75 38 - 126 U/L   Total Bilirubin 0.9 0.3 - 1.2 mg/dL   GFR calc non Af Amer >60 >60 mL/min   GFR calc Af Amer >60 >60 mL/min   Anion gap 13 5 - 15  CBC with Differential  Result Value Ref Range   WBC 9.2 4.0 - 10.5 K/uL   RBC 4.30 3.87 - 5.11 MIL/uL   Hemoglobin 13.2 12.0 - 15.0 g/dL   HCT 95.2 36 - 46 %   MCV 93.0 80.0 - 100.0 fL   MCH 30.7 26.0 - 34.0 pg   MCHC 33.0 30.0 - 36.0 g/dL   RDW 84.1 32.4 - 40.1 %   Platelets 343 150 - 400 K/uL   nRBC 0.0 0.0 - 0.2 %   Neutrophils Relative % 66 %   Neutro Abs 6.1 1.7 - 7.7 K/uL   Lymphocytes Relative 24 %   Lymphs Abs 2.2 0.7 - 4.0 K/uL   Monocytes Relative 8 %   Monocytes Absolute 0.7 0 - 1 K/uL   Eosinophils Relative 2 %   Eosinophils Absolute 0.2 0 - 0 K/uL   Basophils Relative 0 %   Basophils Absolute 0.0 0 - 0 K/uL   Immature Granulocytes 0 %   Abs Immature Granulocytes 0.02  0.00 - 0.07 K/uL  I-Stat beta hCG blood, ED  Result Value Ref Range   I-stat hCG, quantitative <5.0 <5 mIU/mL   Comment 3           CT Soft Tissue Neck W Contrast  Result Date: 06/29/2020 CLINICAL DATA:  39 year old female with right facial swelling and dental pain. EXAM: CT NECK WITH CONTRAST TECHNIQUE: Multidetector CT imaging of the neck was performed using the standard protocol following the bolus administration of intravenous contrast. CONTRAST:  35mL OMNIPAQUE IOHEXOL 300 MG/ML  SOLN COMPARISON:  None. FINDINGS: Pharynx and larynx: Laryngeal and pharyngeal soft tissue contours are within normal limits. The parapharyngeal and retropharyngeal spaces are within normal limits. Salivary glands: Sublingual space, submandibular glands, and parotid glands are within normal limits. Thyroid: Negative. Lymph nodes: Extensive soft tissue swelling and stranding throughout the right face centered at the level of the right mandible. Underlying carious right mandible posterior molar and wisdom tooth. Involvement of the lower right masticator space. Confluent subcutaneous edema tracking underneath the right mandible, with a reactive 9 mm right level 1 B lymph node on series 3, image 41. But no abscess or drainable fluid collection is identified. No soft tissue gas. Mild reactive appearing right level 2 nodes. Other bilateral lymph node stations are within normal limits. Vascular: Major vascular structures in the neck and at the skull base are patent, including the right IJ. Limited intracranial: Minimally included posterior fossa appears negative. Visualized orbits: Minimally included. Mastoids and visualized paranasal sinuses: Mild mucosal thickening and bubbly opacity in the left maxillary sinus appears unrelated to the above. Other visible paranasal sinuses are clear. Skeleton: Carious right mandible posterior molar and wisdom tooth both with some periapical lucency, although no dehiscence of the mandible or cortical  breakthrough identified. Mild C5-C6 disc and endplate degeneration. No acute osseous abnormality identified. Upper chest: Negative. IMPRESSION: Cellulitis throughout the right face is centered at the level of the right mandible and might be odontogenic, with nearby carious right mandible posterior molar and wisdom tooth. Reactive right level 1 and 2 lymph nodes. But no abscess or other complicating features at this time. Electronically  Signed   By: Odessa Fleming M.D.   On: 06/29/2020 16:53     MDM   Laboratory work-up is reviewed and entirely reassuring.  Patient is afebrile and vital signs stable with exception of elevated blood pressure.  Given patient's worsening symptoms in context of appropriate outpatient antibiotics, CT soft tissue neck with contrast was ordered for further evaluation.  Imaging is what is pending at time of handoff.  On my examination, patient tells me that she went to her dentist at [facility] who referred her to an urgent dental clinic.  They subsequently informed her that she was too high acuity and referred her to the ED for further evaluation and management.  She has been taking her clindamycin at home, as directed.  She also completed a course of Augmentin which failed to improve her symptoms.  She has failed outpatient antibiotic management.  She did not notice any significant swelling until last evening and this morning it was profoundly more pronounced.  There is trismus and halitosis on my examination.  Suspect possible mandibular abscess secondary to odontogenic infection.  CT soft tissue neck with contrast is personally reviewed and demonstrates cellulitis throughout the right face centered over the mandible, likely odontogenic origin.  Nearby carious right mandible involving posterior molar and wisdom tooth.  Reactive lymphadenopathy also noted.  No abscesses or other complicating features at this time.  Patient states that she has only completed 1 day worth of her  clindamycin prescription.  I feel as though this is a more reasonable outpatient antibiotic than the Augmentin that was initially provided.  Patient will need to continue with outpatient clindamycin antibiotics and follow-up with her dentist.  She will ultimately require intervention by an oral surgeon, there are none on-call here in the ED today.  Patient was successfully p.o. challenged.  Will prescribe naproxen as well as Vicodin for breakthrough pain.  Patient with elevated BP today. Unlikely to be related to CC with exception of her associated pain symptoms and patient is without symptoms concerning for end organ damage. I discussed with patient their elevated blood pressure and need for close outpatient management of their hypertension. Patient counseled on long-term effects of elevated BP including kidney damage, vascular damage, retinopathy, and risk of stroke and other dangerous outcomes. Patient understanding of close PCP follow up.  Patient felt improved after IV clindamycin and pain medications here in ED.  She is reassured by work-up and plan.  Julia [name] was evaluated in Emergency Department on 06/29/2020 for the symptoms described in the history of present illness. She was evaluated in the context of the global COVID-19 pandemic, which necessitated consideration that the patient might be at risk for infection with the SARS-CoV-2 virus that causes COVID-19. Institutional protocols and algorithms that pertain to the evaluation of patients at risk for COVID-19 are in a state of rapid change based on information released by regulatory bodies including the CDC and federal and state organizations. These policies and algorithms were followed during the patient's care in the ED.    Lorelee New, PA-C 06/29/20 1727    Lorelee New, PA-C 06/29/20 Flossie Buffy    Pollyann Savoy, MD 06/29/20 2022
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
## 2020-06-29 NOTE — ED Notes (Signed)
The pt notified staff that she was leaving.

## 2020-06-29 NOTE — Discharge Instructions (Addendum)
Please continue with your clindamycin, as directed.  You will need to follow-up with your dentist regarding today's encounter.  You have soft tissue infection (cellulitis) that is likely dental in origin.  There is no large abscess that requires drainage.  Your dentist will likely refer you to an oral surgeon for definitive intervention.  Please take the ibuprofen, as directed.  This will help with the inflammation.  I have also prescribed you a course of Vicodin which can take for breakthrough pain symptoms.  Please return to the ED or seek immediate medical attention should you experience any new or worsening symptoms.

## 2021-04-17 ENCOUNTER — Other Ambulatory Visit (HOSPITAL_COMMUNITY)
Admission: RE | Admit: 2021-04-17 | Discharge: 2021-04-17 | Disposition: A | Discharge status: Home / Self Care | Payer: 59 | Source: Ambulatory Visit | Attending: Obstetrics | Admitting: Obstetrics

## 2021-04-17 ENCOUNTER — Other Ambulatory Visit: Payer: Self-pay

## 2021-04-17 ENCOUNTER — Encounter: Payer: Self-pay | Admitting: Obstetrics

## 2021-04-17 ENCOUNTER — Ambulatory Visit (INDEPENDENT_AMBULATORY_CARE_PROVIDER_SITE_OTHER): Payer: 59 | Admitting: Obstetrics

## 2021-04-17 VITALS — Ht 64.0 in | Wt 179.7 lb

## 2021-04-17 DIAGNOSIS — N898 Other specified noninflammatory disorders of vagina: Secondary | ICD-10-CM

## 2021-04-17 DIAGNOSIS — Z0000 Encounter for general adult medical examination without abnormal findings: Secondary | ICD-10-CM | POA: Diagnosis not present

## 2021-04-17 DIAGNOSIS — Z01419 Encounter for gynecological examination (general) (routine) without abnormal findings: Secondary | ICD-10-CM | POA: Diagnosis present

## 2021-04-17 DIAGNOSIS — Z113 Encounter for screening for infections with a predominantly sexual mode of transmission: Secondary | ICD-10-CM | POA: Diagnosis not present

## 2021-04-17 DIAGNOSIS — Z3009 Encounter for other general counseling and advice on contraception: Secondary | ICD-10-CM

## 2021-04-17 DIAGNOSIS — L0232 Furuncle of buttock: Secondary | ICD-10-CM

## 2021-04-17 DIAGNOSIS — F17200 Nicotine dependence, unspecified, uncomplicated: Secondary | ICD-10-CM

## 2021-04-17 DIAGNOSIS — F3341 Major depressive disorder, recurrent, in partial remission: Secondary | ICD-10-CM

## 2021-04-17 MED ORDER — VARENICLINE TARTRATE 1 MG PO TABS: 1.0000 mg | ORAL_TABLET | Freq: Two times a day (BID) | ORAL | 1 refills | Status: DC

## 2021-04-17 MED ORDER — VARENICLINE TARTRATE 0.5 MG X 11 & 1 MG X 42 PO MISC: ORAL | 0 refills | Status: DC

## 2021-04-17 MED ORDER — AMOXICILLIN-POT CLAVULANATE 875-125 MG PO TABS: 1.0000 | ORAL_TABLET | Freq: Two times a day (BID) | ORAL | 0 refills | Status: DC

## 2021-04-17 MED ORDER — SERTRALINE HCL 50 MG PO TABS: 50.0000 mg | ORAL_TABLET | Freq: Every day | ORAL | 11 refills | Status: DC
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
## 2021-04-17 NOTE — Progress Notes (Signed)
Patient presents for AEX. Patient states that she has a boil on her butt that started lasted week. She states that she hasn't done much to help boil. She would like STD testing.   Last Pap: 03/15/2020 ASCUS HRHPV

## 2021-04-18 LAB — HIV ANTIBODY (ROUTINE TESTING W REFLEX): HIV Screen 4th Generation wRfx: NONREACTIVE

## 2021-04-18 LAB — HEPATITIS B SURFACE ANTIGEN: Hepatitis B Surface Ag: NEGATIVE

## 2021-04-18 LAB — HEPATITIS C ANTIBODY: Hep C Virus Ab: <0.1 s/co ratio (ref 0.0–0.9)

## 2021-04-18 LAB — RPR: RPR Ser Ql: NONREACTIVE

## 2021-04-19 ENCOUNTER — Other Ambulatory Visit: Payer: Self-pay | Admitting: Obstetrics

## 2021-04-19 DIAGNOSIS — B9689 Other specified bacterial agents as the cause of diseases classified elsewhere: Secondary | ICD-10-CM

## 2021-04-19 LAB — CERVICOVAGINAL ANCILLARY ONLY
Bacterial Vaginitis (gardnerella): POSITIVE — AB
Candida Glabrata: NEGATIVE
Candida Vaginitis: NEGATIVE
Chlamydia: NEGATIVE
Comment: NEGATIVE
Comment: NEGATIVE
Comment: NEGATIVE
Comment: NEGATIVE
Comment: NEGATIVE
Comment: NORMAL
Neisseria Gonorrhea: NEGATIVE
Trichomonas: NEGATIVE

## 2021-04-19 MED ORDER — METRONIDAZOLE 500 MG PO TABS: 500.0000 mg | ORAL_TABLET | Freq: Two times a day (BID) | ORAL | 2 refills | Status: DC

## 2021-04-20 ENCOUNTER — Telehealth: Payer: Self-pay

## 2021-04-20 NOTE — Telephone Encounter (Signed)
Attempted to reach pt by phone regarding recent results pt not ava not able to leave a vm Mychart message was sent to pt as well.

## 2021-04-20 NOTE — Telephone Encounter (Signed)
-----   Message from [name], MD sent at 04/19/2021  6:03 PM EDT ----- Flagyl Rx for BV

## 2021-04-25 LAB — CYTOLOGY - PAP: Diagnosis: NEGATIVE

## 2022-03-31 IMAGING — CT CT NECK W/ CM
3 of 4 series · 12 of 33 positions shown, 14 images · IV contrast (omnipaque)
Comparison: None.

CLINICAL DATA: 39-year-old female with right facial swelling and
dental pain.

EXAM:
CT NECK WITH CONTRAST
TECHNIQUE: Multidetector CT imaging of the neck was performed using the
standard protocol following the bolus administration of intravenous
contrast.
CONTRAST:  75mL OMNIPAQUE IOHEXOL 300 MG/ML  SOLN

[Series 6: axial · axial · 0.30mm/px · z∈[+1185,+1368]mm · 4 of 140 slices shown, 5 images]
[im 20/140  soft-tissue]
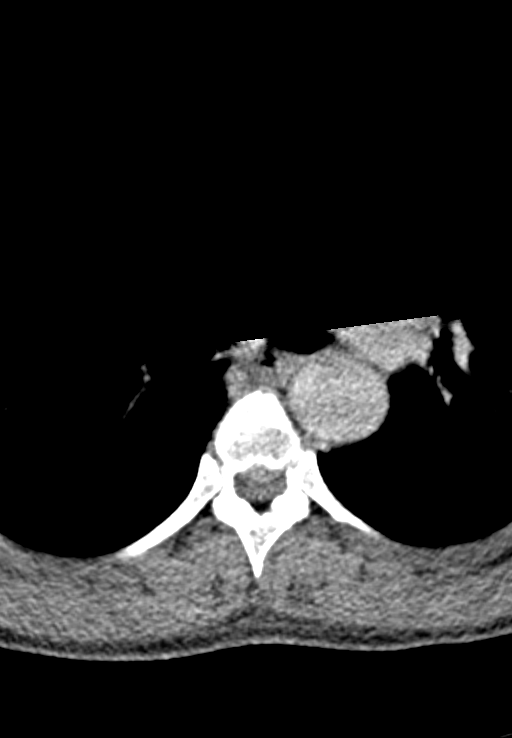
[im 20/140  bone]
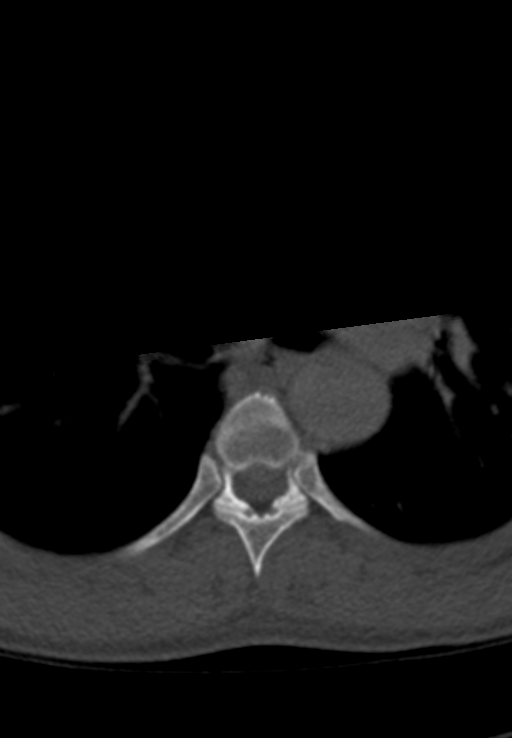
[im 60/140  bone]
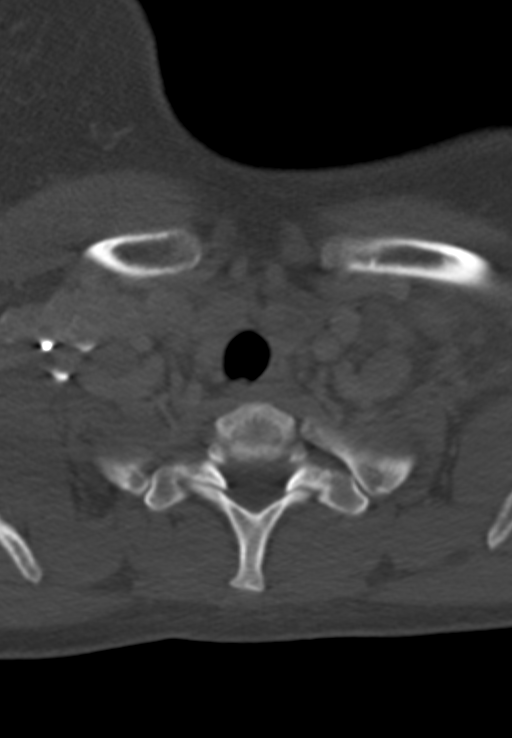
[im 80/140  bone]
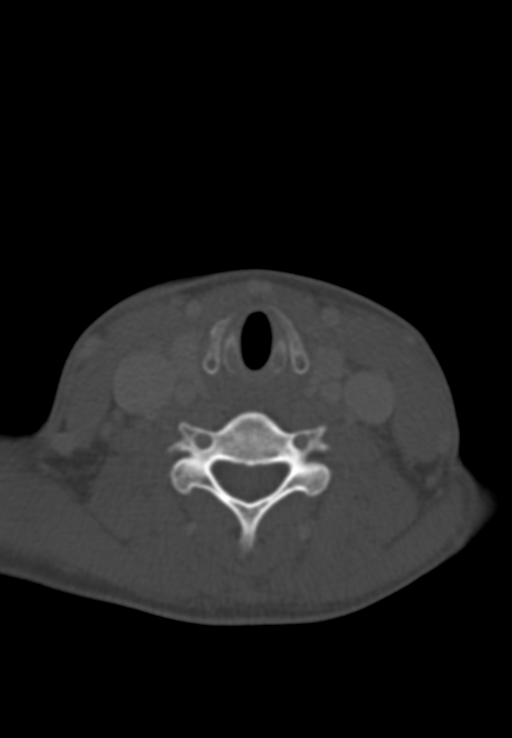
[im 120/140  bone]
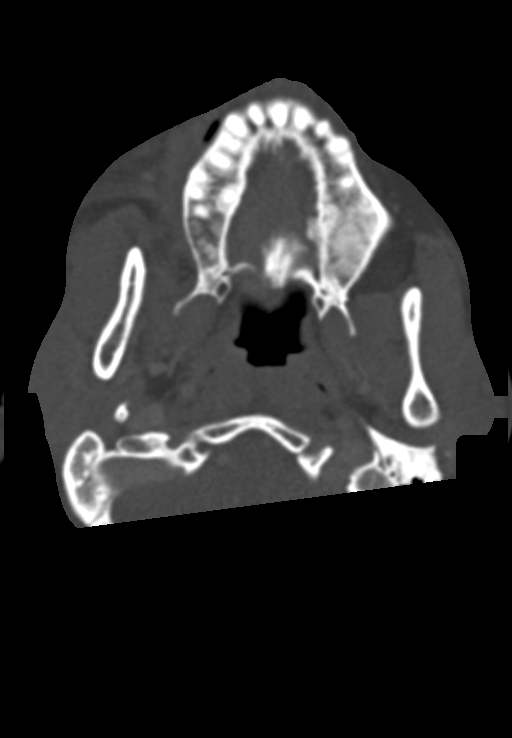

[Series 7: coronal · coronal · 0.36mm/px · 3 of 138 slices shown]
[im 28/138  bone]
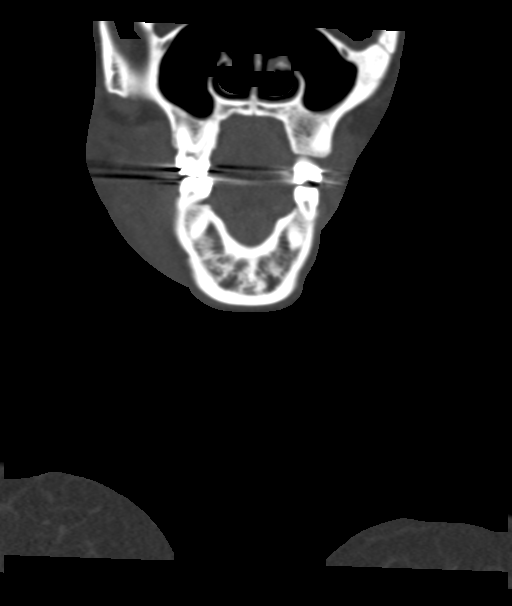
[im 55/138  bone]
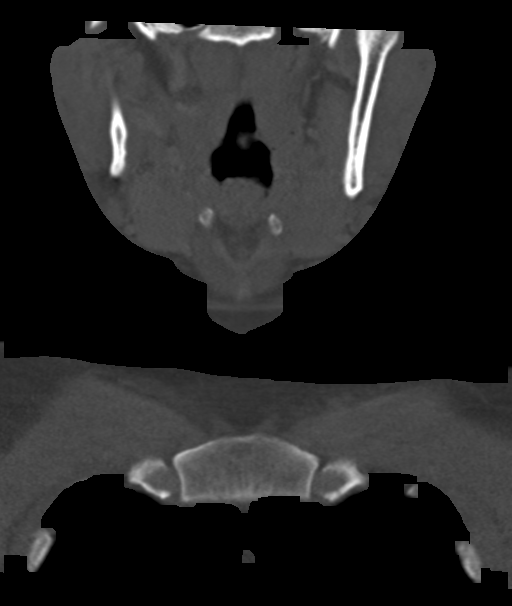
[im 83/138  bone]
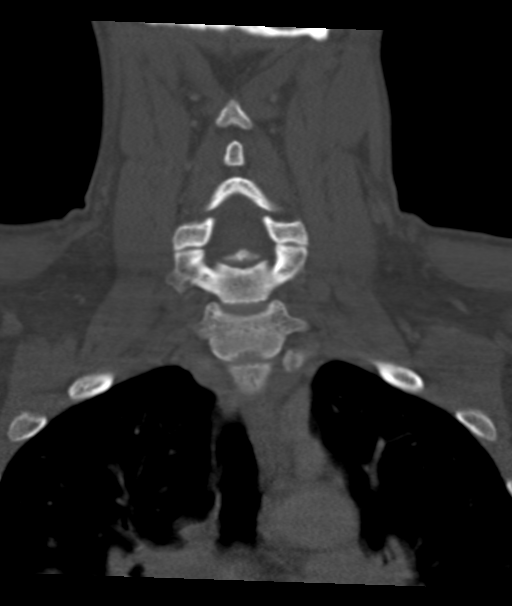

[Series 8: sagittal · sagittal · 0.45mm/px · 5 of 101 slices shown, 6 images]
[im 34/101  bone]
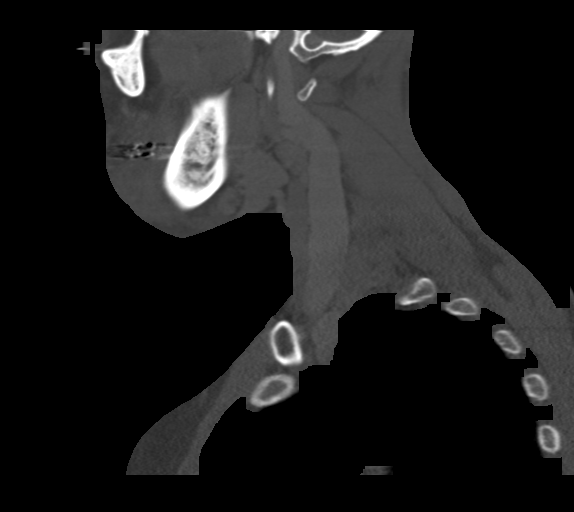
[im 42/101  bone]
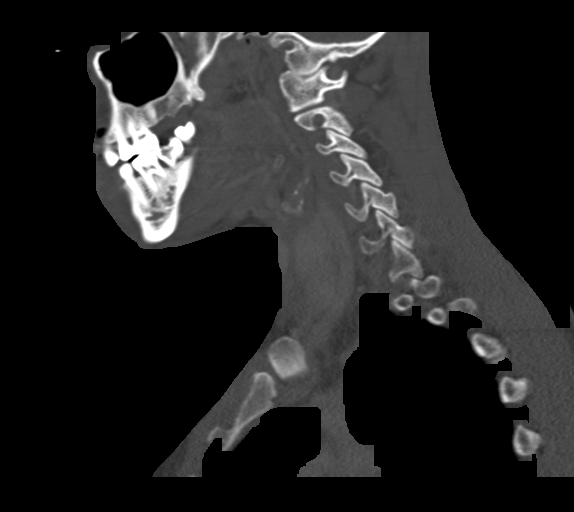
[im 51/101  soft-tissue]
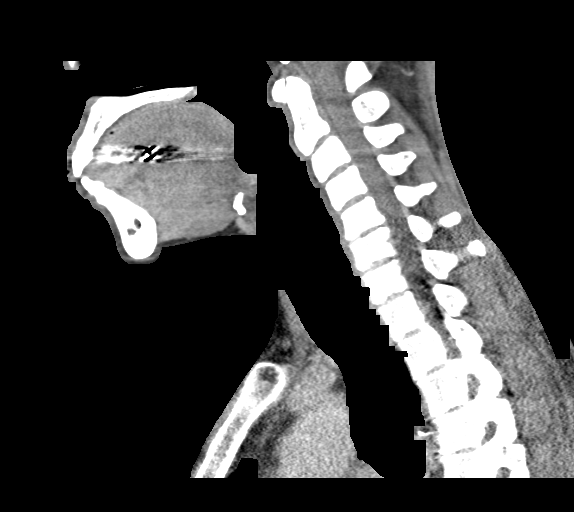
[im 51/101  bone]
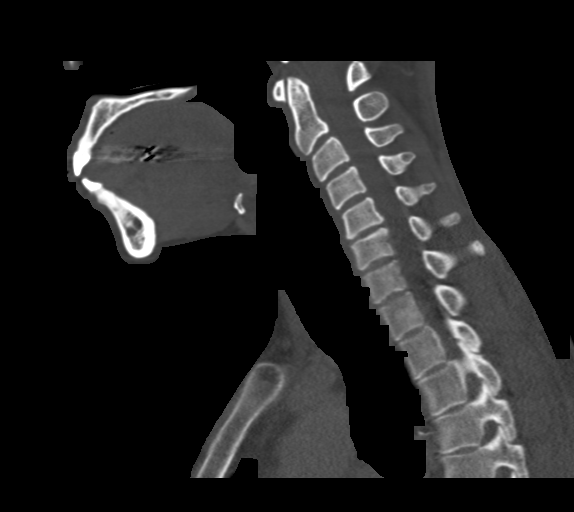
[im 59/101  bone]
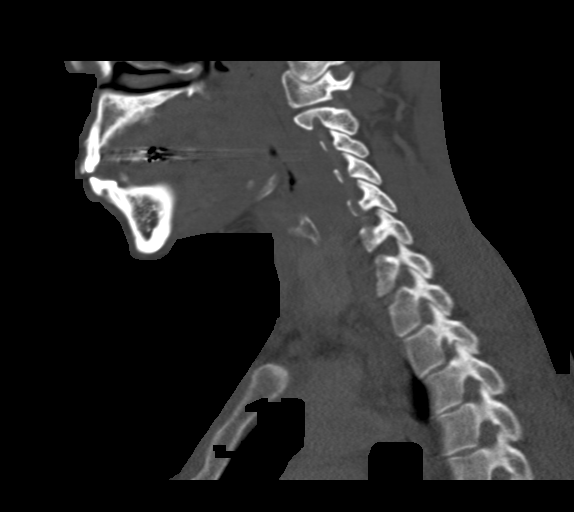
[im 67/101  bone]
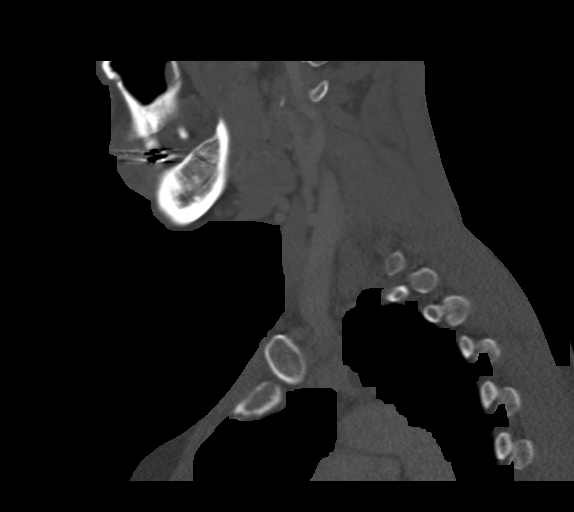

[12 of 33 positions shown; findings below may reference images not displayed]

FINDINGS: Pharynx and larynx: Laryngeal and pharyngeal soft tissue contours
are within normal limits. The parapharyngeal and retropharyngeal
spaces are within normal limits.

Salivary glands: Sublingual space, submandibular glands, and parotid
glands are within normal limits.

Thyroid: Negative.

Lymph nodes: Extensive soft tissue swelling and stranding throughout
the right face centered at the level of the right mandible.
Underlying carious right mandible posterior molar and wisdom tooth.
Involvement of the lower right masticator space. Confluent
subcutaneous edema tracking underneath the right mandible, with a
reactive 9 mm right level 1 B lymph node on series 3, image 41. But
no abscess or drainable fluid collection is identified. No soft
tissue gas.

Mild reactive appearing right level 2 nodes. Other bilateral lymph
node stations are within normal limits.

Vascular: Major vascular structures in the neck and at the skull
base are patent, including the right IJ.

Limited intracranial: Minimally included posterior fossa appears
negative.

Visualized orbits: Minimally included.

Mastoids and visualized paranasal sinuses: Mild mucosal thickening
and bubbly opacity in the left maxillary sinus appears unrelated to
the above. Other visible paranasal sinuses are clear.

Skeleton: Carious right mandible posterior molar and wisdom tooth
both with some periapical lucency, although no dehiscence of the
mandible or cortical breakthrough identified. Mild C5-C6 disc and
endplate degeneration. No acute osseous abnormality identified.

Upper chest: Negative.
IMPRESSION: Cellulitis throughout the right face is centered at the level of the
right mandible and might be odontogenic, with nearby carious right
mandible posterior molar and wisdom tooth.
Reactive right level 1 and 2 lymph nodes. But no abscess or other
complicating features at this time.

## 2022-04-18 ENCOUNTER — Other Ambulatory Visit: Payer: Self-pay | Admitting: Obstetrics

## 2022-04-18 DIAGNOSIS — F3341 Major depressive disorder, recurrent, in partial remission: Secondary | ICD-10-CM

## 2022-12-03 ENCOUNTER — Emergency Department (HOSPITAL_BASED_OUTPATIENT_CLINIC_OR_DEPARTMENT_OTHER)
Admission: EM | Admit: 2022-12-03 | Discharge: 2022-12-04 | Disposition: A | Discharge status: Home / Self Care | Payer: Medicaid Other | Attending: Emergency Medicine | Admitting: Emergency Medicine

## 2022-12-03 ENCOUNTER — Other Ambulatory Visit: Payer: Self-pay

## 2022-12-03 ENCOUNTER — Encounter (HOSPITAL_BASED_OUTPATIENT_CLINIC_OR_DEPARTMENT_OTHER): Payer: Self-pay

## 2022-12-03 DIAGNOSIS — M792 Neuralgia and neuritis, unspecified: Secondary | ICD-10-CM

## 2022-12-03 DIAGNOSIS — R200 Anesthesia of skin: Secondary | ICD-10-CM | POA: Diagnosis present

## 2022-12-03 DIAGNOSIS — F17210 Nicotine dependence, cigarettes, uncomplicated: Secondary | ICD-10-CM | POA: Diagnosis not present

## 2022-12-03 NOTE — ED Triage Notes (Signed)
Pt also reports migraine beginning this morning.

## 2022-12-03 NOTE — ED Triage Notes (Signed)
Pt reports pins/needles feeling with burning sensation down entire right arm beginning 48hrs ago. Has been intermittent. Most recently beginning at 0900. Sensation and mobility intact. Strong radial pulses. No redness/warmth/swelling noted. Pt states it is extremely painful. No vision changes, slurred speech, weakness noted.

## 2022-12-04 MED ORDER — HYDROCODONE-ACETAMINOPHEN 5-325 MG PO TABS: 2.0000 | ORAL_TABLET | ORAL | 0 refills | Status: DC | PRN

## 2022-12-04 MED ORDER — PREGABALIN 75 MG PO CAPS: 75.0000 mg | ORAL_CAPSULE | Freq: Two times a day (BID) | ORAL | 0 refills | Status: DC
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
## 2023-02-11 ENCOUNTER — Other Ambulatory Visit: Payer: Self-pay

## 2023-02-11 ENCOUNTER — Encounter (HOSPITAL_BASED_OUTPATIENT_CLINIC_OR_DEPARTMENT_OTHER): Payer: Self-pay

## 2023-02-11 DIAGNOSIS — Z1152 Encounter for screening for COVID-19: Secondary | ICD-10-CM | POA: Insufficient documentation

## 2023-02-11 DIAGNOSIS — B349 Viral infection, unspecified: Secondary | ICD-10-CM | POA: Diagnosis not present

## 2023-02-11 DIAGNOSIS — R52 Pain, unspecified: Secondary | ICD-10-CM | POA: Diagnosis present

## 2023-02-11 NOTE — ED Triage Notes (Signed)
With sore throat congestion N/V/D and son has been sick as well.

## 2023-02-12 ENCOUNTER — Emergency Department (HOSPITAL_BASED_OUTPATIENT_CLINIC_OR_DEPARTMENT_OTHER)
Admission: EM | Admit: 2023-02-12 | Discharge: 2023-02-12 | Disposition: A | Discharge status: Home / Self Care | Payer: Medicaid Other | Attending: Emergency Medicine | Admitting: Emergency Medicine

## 2023-02-12 DIAGNOSIS — B349 Viral infection, unspecified: Secondary | ICD-10-CM

## 2023-02-12 LAB — GROUP A STREP BY PCR: Group A Strep by PCR: NOT DETECTED

## 2023-02-12 LAB — RESP PANEL BY RT-PCR (RSV, FLU A&B, COVID)  RVPGX2
Influenza A by PCR: NEGATIVE
Influenza B by PCR: NEGATIVE
Resp Syncytial Virus by PCR: NEGATIVE
SARS Coronavirus 2 by RT PCR: NEGATIVE

## 2023-02-12 MED ORDER — LOPERAMIDE HCL 2 MG PO CAPS: 2.0000 mg | ORAL_CAPSULE | Freq: Four times a day (QID) | ORAL | 0 refills | Status: DC | PRN

## 2023-02-12 MED ORDER — NAPROXEN 500 MG PO TABS: 500.0000 mg | ORAL_TABLET | Freq: Two times a day (BID) | ORAL | 0 refills | Status: DC

## 2023-02-12 MED ORDER — KETOROLAC TROMETHAMINE 15 MG/ML IJ SOLN
15.0000 mg | Freq: Once | INTRAMUSCULAR | Status: AC
  Administered 2023-02-12: 15 mg via INTRAMUSCULAR
  Filled 2023-02-12: qty 1

## 2023-02-12 MED ORDER — ONDANSETRON 4 MG PO TBDP: 4.0000 mg | ORAL_TABLET | Freq: Three times a day (TID) | ORAL | 0 refills | Status: DC | PRN
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
## 2023-02-12 NOTE — Discharge Instructions (Addendum)
You were evaluated in the Emergency Department and after careful evaluation, we did not find any emergent condition requiring admission or further testing in the hospital.  Your exam/testing today is overall reassuring.  Symptoms likely due to a viral illness.  Strep test was negative.  Plenty of fluids and rest at home, use the Zofran as needed for nausea, use the Imodium as needed for diarrhea, use the Naprosyn as needed for aches and pains.  Please return to the Emergency Department if you experience any worsening of your condition.   Thank you for allowing [place] to be a part of your care.

## 2024-01-11 ENCOUNTER — Emergency Department (HOSPITAL_BASED_OUTPATIENT_CLINIC_OR_DEPARTMENT_OTHER)
Admission: EM | Admit: 2024-01-11 | Discharge: 2024-01-11 | Disposition: A | Discharge status: Home / Self Care | Attending: Emergency Medicine | Admitting: Emergency Medicine

## 2024-01-11 ENCOUNTER — Other Ambulatory Visit: Payer: Self-pay

## 2024-01-11 ENCOUNTER — Encounter (HOSPITAL_BASED_OUTPATIENT_CLINIC_OR_DEPARTMENT_OTHER): Payer: Self-pay | Admitting: Emergency Medicine

## 2024-01-11 ENCOUNTER — Emergency Department (HOSPITAL_BASED_OUTPATIENT_CLINIC_OR_DEPARTMENT_OTHER)

## 2024-01-11 DIAGNOSIS — F17210 Nicotine dependence, cigarettes, uncomplicated: Secondary | ICD-10-CM | POA: Insufficient documentation

## 2024-01-11 DIAGNOSIS — J029 Acute pharyngitis, unspecified: Secondary | ICD-10-CM

## 2024-01-11 DIAGNOSIS — J020 Streptococcal pharyngitis: Secondary | ICD-10-CM | POA: Diagnosis not present

## 2024-01-11 LAB — BASIC METABOLIC PANEL
Anion gap: 10 (ref 5–15)
BUN: 10 mg/dL (ref 6–20)
CO2: 23 mmol/L (ref 22–32)
Calcium: 9.3 mg/dL (ref 8.9–10.3)
Chloride: 101 mmol/L (ref 98–111)
Creatinine, Ser: 0.67 mg/dL (ref 0.44–1.00)
GFR, Estimated: >60 mL/min (ref >60)
Glucose, Bld: 112 mg/dL — ABNORMAL HIGH (ref 70–99)
Potassium: 3.7 mmol/L (ref 3.5–5.1)
Sodium: 134 mmol/L — ABNORMAL LOW (ref 135–145)

## 2024-01-11 LAB — CBC
HCT: 38 % (ref 36.0–46.0)
Hemoglobin: 12.6 g/dL (ref 12.0–15.0)
MCH: 29 pg (ref 26.0–34.0)
MCHC: 33.2 g/dL (ref 30.0–36.0)
MCV: 87.4 fL (ref 80.0–100.0)
Platelets: 308 10*3/uL (ref 150–400)
RBC: 4.35 MIL/uL (ref 3.87–5.11)
RDW: 13.8 % (ref 11.5–15.5)
WBC: 14.6 10*3/uL — ABNORMAL HIGH (ref 4.0–10.5)
nRBC: 0 % (ref 0.0–0.2)

## 2024-01-11 LAB — GROUP A STREP BY PCR: Group A Strep by PCR: DETECTED — AB

## 2024-01-11 MED ORDER — IBUPROFEN 600 MG PO TABS: 600.0000 mg | ORAL_TABLET | Freq: Four times a day (QID) | ORAL | 0 refills | Status: DC | PRN

## 2024-01-11 MED ORDER — SODIUM CHLORIDE 0.9 % IV SOLN
3.0000 g | Freq: Once | INTRAVENOUS | Status: AC
  Administered 2024-01-11: 3 g via INTRAVENOUS

## 2024-01-11 MED ORDER — AMOXICILLIN-POT CLAVULANATE 875-125 MG PO TABS: 1.0000 | ORAL_TABLET | Freq: Two times a day (BID) | ORAL | 0 refills | Status: DC

## 2024-01-11 MED ORDER — AMOXICILLIN-POT CLAVULANATE 875-125 MG PO TABS: 1.0000 | ORAL_TABLET | Freq: Two times a day (BID) | ORAL | 0 refills | Status: AC

## 2024-01-11 MED ORDER — DEXAMETHASONE SODIUM PHOSPHATE 10 MG/ML IJ SOLN
10.0000 mg | Freq: Once | INTRAMUSCULAR | Status: AC
  Administered 2024-01-11: 10 mg via INTRAVENOUS
  Filled 2024-01-11: qty 1

## 2024-01-11 MED ORDER — KETOROLAC TROMETHAMINE 15 MG/ML IJ SOLN
15.0000 mg | Freq: Once | INTRAMUSCULAR | Status: AC
  Administered 2024-01-11: 15 mg via INTRAVENOUS
  Filled 2024-01-11: qty 1

## 2024-01-11 NOTE — ED Notes (Signed)
 Transport to ct

## 2024-01-11 NOTE — ED Notes (Signed)
Pt sipping on water 

## 2024-01-11 NOTE — ED Provider Notes (Signed)
 [facility] AT [facility] Provider Note   CSN: [id number] Arrival date & time: 01/11/24  1425     History  Chief Complaint  Patient presents with   Sore Throat    Julia [name] is a 43 y.o. female.   Sore Throat   43 year old female presents emergency department with complaints of right-sided facial swelling/right-sided throat pain.  Reports abrupt onset this morning after she woke up.  States it hurts a little bit to open her mouth but still is able to.  Reports pain with swallowing but states she is still able to.  Denies any difficulty breathing, feelings of throat closing on the heart..  Denies any dental pain or known cuts/lacerations.  States she has not had symptoms like this before.  Has taken no medication for her symptoms.  Denies any fevers, chills, nausea, vomiting.  Past medical history significant for anemia, depression, pregnancy-induced hypertension.  Home Medications Prior to Admission medications   Medication Sig Start Date End Date Taking? Authorizing Provider  amoxicillin-clavulanate (AUGMENTIN) 875-125 MG tablet Take 1 tablet by mouth every 12 (twelve) hours for 10 days. 01/11/24 01/21/24  Peter Garter, PA  HYDROcodone-acetaminophen (NORCO) 5-325 MG tablet Take 2 tablets by mouth every 4 (four) hours as needed for severe pain. 12/04/22   Molpus, John, MD  ibuprofen (ADVIL) 600 MG tablet Take 1 tablet (600 mg total) by mouth every 6 (six) hours as needed. 01/11/24   Peter Garter, PA  loperamide (IMODIUM) 2 MG capsule Take 1 capsule (2 mg total) by mouth 4 (four) times daily as needed for diarrhea or loose stools. 02/12/23   Sabas Sous, MD  naproxen (NAPROSYN) 500 MG tablet Take 1 tablet (500 mg total) by mouth 2 (two) times daily. 02/12/23   Sabas Sous, MD  ondansetron (ZOFRAN-ODT) 4 MG disintegrating tablet Take 1 tablet (4 mg total) by mouth every 8 (eight) hours as needed for nausea or vomiting. 02/12/23   Sabas Sous, MD  pregabalin (LYRICA) 75 MG capsule Take 1 capsule (75 mg total) by mouth 2 (two) times daily. 12/04/22   Molpus, John, MD  Prenatal Vit-Iron Carbonyl-FA (PRENATABS RX) 29-1 MG TABS Take 1 tablet by mouth daily before breakfast. 03/15/20   [name], MD  sertraline (ZOLOFT) 50 MG tablet TAKE 1 TABLET(50 MG) BY MOUTH DAILY 04/23/22   [name], MD  varenicline (CHANTIX CONTINUING MONTH PAK) 1 MG tablet Take 1 tablet (1 mg total) by mouth 2 (two) times daily. 04/17/21   [name], MD  varenicline (CHANTIX STARTING MONTH PAK) 0.5 MG X 11 & 1 MG X 42 tablet Take one 0.5 mg tablet by mouth once daily for 3 days, then increase to one 0.5 mg tablet twice daily for 4 days, then increase to one 1 mg tablet twice daily. 04/17/21   [name], MD      Allergies    Patient has no known allergies.    Review of Systems   Review of Systems  All other systems reviewed and are negative.   Physical Exam Updated Vital Signs BP 114/75   Pulse 68   Temp 99.4 F (37.4 C) (Oral)   Resp 16   Ht 5\' 4"  (1.626 m)   Wt 71.2 kg   SpO2 97%   BMI 26.95 kg/m  Physical Exam Vitals and nursing note reviewed.  Constitutional:      General: She is not in acute distress.  Appearance: She is well-developed.  HENT:     Head: Normocephalic and atraumatic.     Mouth/Throat:     Comments: No posterior pharyngeal erythema.  Uvula midline and symmetric with phonation.  No sublingual/submandibular swelling.  Tonsils right slightly greater than left without exudate.  Minimal swelling appreciated right side of face near angle of mandible without overlying tenderness. Eyes:     Conjunctiva/sclera: Conjunctivae normal.  Cardiovascular:     Rate and Rhythm: Normal rate and regular rhythm.     Heart sounds: No murmur heard. Pulmonary:     Effort: Pulmonary effort is normal. No respiratory distress.     Breath sounds: Normal breath sounds.  Abdominal:     Palpations: Abdomen is soft.      Tenderness: There is no abdominal tenderness.  Musculoskeletal:        General: No swelling.     Cervical back: Neck supple.  Skin:    General: Skin is warm and dry.     Capillary Refill: Capillary refill takes less than 2 seconds.  Neurological:     Mental Status: She is alert.  Psychiatric:        Mood and Affect: Mood normal.     ED Results / Procedures / Treatments   Labs (all labs ordered are listed, but only abnormal results are displayed) Labs Reviewed  GROUP A STREP BY PCR - Abnormal; Notable for the following components:      Result Value   Group A Strep by PCR DETECTED (*)    All other components within normal limits  BASIC METABOLIC PANEL - Abnormal; Notable for the following components:   Sodium 134 (*)    Glucose, Bld 112 (*)    All other components within normal limits  CBC - Abnormal; Notable for the following components:   WBC 14.6 (*)    All other components within normal limits    EKG None  Radiology CT Maxillofacial Wo Contrast Result Date: 01/11/2024 CLINICAL DATA:  Right facial swelling, throat pain. EXAM: CT MAXILLOFACIAL WITHOUT CONTRAST TECHNIQUE: Multidetector CT imaging of the maxillofacial structures was performed. Multiplanar CT image reconstructions were also generated. RADIATION DOSE REDUCTION: This exam was performed according to the departmental dose-optimization program which includes automated exposure control, adjustment of the mA and/or kV according to patient size and/or use of iterative reconstruction technique. COMPARISON:  CT neck 06/29/2020 FINDINGS: Osseous: Maxilla: Intact Pterygoid Plates: Intact Zygomatic Arch: Intact Orbits: Intact Ethmoid: Intact Sphenoid: Intact Frontal:Intact Mandible: Intact. No condylar dislocation. Nasal: Chronic right nasal bone deformity. No evidence of acute fracture. Nasal Septum: Intact. No significant deviation. Orbits: Orbits are symmetric. Globes intact and lens is normally located. The extraocular  muscles and optic nerve sheath complexes are unremarkable. No retrobulbar hematoma. No significant preseptal soft tissue swelling. Sinuses: Mild mucosal thickening in the left maxillary sinus primarily in the alveolar recess. Additional minimal mucosal thickening in the alveolar recess of the right maxillary sinus. Mild scattered mucosal thickening in the ethmoid sinuses and in the anterior sphenoid sinuses. No air-fluid levels. Hypoplastic frontal sinuses. Soft tissues: There is no significant soft tissue swelling of the visualized facial soft tissues. No evidence of hematoma or fluid collection. Subtle mild asymmetric prominence of the right palatine tonsil. Dental caries most pronounced involving the right mandibular second and third molars. Limited intracranial: No significant or unexpected finding. IMPRESSION: Mild asymmetric prominence of the right palatine tonsil. Recommend correlation with direct visualization. Consider contrast enhanced CT neck for further evaluation if clinically indicated.  Chronic right nasal bone deformity. No acute maxillofacial fracture. Dental caries most pronounced involving the right mandibular second and third molars. Electronically Signed   By: Emily Filbert M.D.   On: 01/11/2024 16:30    Procedures Procedures    Medications Ordered in ED Medications  ketorolac (TORADOL) 15 MG/ML injection 15 mg (15 mg Intravenous Given 01/11/24 1525)  dexamethasone (DECADRON) injection 10 mg (10 mg Intravenous Given 01/11/24 1657)  Ampicillin-Sulbactam (UNASYN) 3 g in sodium chloride 0.9 % 100 mL IVPB (3 g Intravenous New Bag/Given 01/11/24 1657)    ED Course/ Medical Decision Making/ A&P                                 Medical Decision Making Amount and/or Complexity of Data Reviewed Labs: ordered. Radiology: ordered.  Risk Prescription drug management.   This patient presents to the ED for concern of facial pain/swelling, this involves an extensive number of treatment  options, and is a complaint that carries with it a high risk of complications and morbidity.  The differential diagnosis includes cellulitis, parotitis, sialadenitis, sialolithiasis, angioedema, abscess, other   Co morbidities that complicate the patient evaluation  See HPI   Additional history obtained:  Additional history obtained from EMR External records from outside source obtained and reviewed including hospital records   Lab Tests:  I Ordered, and personally interpreted labs.  The pertinent results include: Group A strep positive.  Leukocytosis of 14.6.  No evidence of anemia.  Platelets within range.  Mild hyponatremia 134 otherwise, electrolytes within limits.  No renal dysfunction.   Imaging Studies ordered:  I ordered imaging studies including CT maxillofacial I independently visualized and interpreted imaging which showed mild asymmetric prominence right palate teen tonsil.  Chronic right nasal bone deformity.  Dental caries. I agree with the radiologist interpretation  Cardiac Monitoring: / EKG:  The patient was maintained on a cardiac monitor.  I personally viewed and interpreted the cardiac monitored which showed an underlying rhythm of: Sinus rhythm   Consultations Obtained:  N/a   Problem List / ED Course / Critical interventions / Medication management  Sore throat I ordered medication including Toradol, Unasyn, Decadron   Reevaluation of the patient after these medicines showed that the patient improved I have reviewed the patients home medicines and have made adjustments as needed   Social Determinants of Health:  Chronic cigarette use.  Denies illicit drug use.   Test / Admission - Considered:  Sore throat Vitals signs within normal range and stable throughout visit. Laboratory/imaging studies significant for: See above 43 year old female presents emergency department with complaints of right-sided facial swelling/right-sided throat pain.   Reports abrupt onset this morning after she woke up.  States that she is having pain with opening her jaw secondary to a stretching sensation on the right side of her knees.  Denies any dental pain or known cuts/lacerations.  States she has not had symptoms like this before.  Has taken no medication for her symptoms.  Denies any fevers, chills, nausea, vomiting. On exam, very minimal right-sided tonsillar enlargement when compared to left but uvula still midline and rises symmetrical phonation.  Laboratory studies concerning for strep positive leukocytosis of 14.6.  CT imaging was obtained which did show evidence of right palate teen tonsillar enlargement otherwise, without acute abnormality.  Patient treated with medications while emergency department and did note improvement of symptoms and subsequent tolerance of p.o.  Will  place patient on antibiotics given concern for bacterial infection as cause of symptoms.  Will recommend close follow-up with PCP in the outpatient setting for reassessment.  Treatment plan discussed at length with patient and she acknowledged understanding was agreeable to said plan.  Patient overall well-appearing, afebrile in no acute distress. Worrisome signs and symptoms were discussed with the patient, and the patient acknowledged understanding to return to the ED if noticed. Patient was stable upon discharge.          Final Clinical Impression(s) / ED Diagnoses Final diagnoses:  Sore throat    Rx / DC Orders ED Discharge Orders          Ordered    amoxicillin-clavulanate (AUGMENTIN) 875-125 MG tablet  Every 12 hours,   Status:  Discontinued        01/11/24 1707    ibuprofen (ADVIL) 600 MG tablet  Every 6 hours PRN,   Status:  Discontinued        01/11/24 1707    amoxicillin-clavulanate (AUGMENTIN) 875-125 MG tablet  Every 12 hours        01/11/24 1739    ibuprofen (ADVIL) 600 MG tablet  Every 6 hours PRN        01/11/24 1739              Peter Garter, [place] 01/11/24 1819    Rexford Maus, DO 01/11/24 1835

## 2024-01-11 NOTE — ED Triage Notes (Signed)
 Pt reports sore throat and right sided throat swelling that started this morning upon waking. Denies fevers.

## 2024-01-11 NOTE — Discharge Instructions (Signed)
 As discussed, you did test positive for strep throat and have enlargement of right tonsil which is most likely causing his symptoms.  No appreciable abscess to drain.  Patient antibiotics for treatment of infection as well as medicine for pain.  Recommend follow-up with your primary care for reassessment.  Please do not hesitate to return to emergency department if the worrisome signs and symptoms we discussed become apparent.

## 2024-07-13 ENCOUNTER — Ambulatory Visit

## 2024-07-13 VITALS — BP 159/107 | HR 88

## 2024-07-13 DIAGNOSIS — Z3201 Encounter for pregnancy test, result positive: Secondary | ICD-10-CM

## 2024-07-13 DIAGNOSIS — Z3200 Encounter for pregnancy test, result unknown: Secondary | ICD-10-CM

## 2024-07-13 LAB — POCT URINE PREGNANCY: Preg Test, Ur: POSITIVE — AB

## 2024-07-13 MED ORDER — LABETALOL HCL 200 MG PO TABS: 200.0000 mg | ORAL_TABLET | Freq: Two times a day (BID) | ORAL | 3 refills | Status: AC

## 2024-07-13 MED ORDER — BLOOD PRESSURE KIT DEVI: 1.0000 | 0 refills | Status: AC

## 2024-07-13 NOTE — Progress Notes (Signed)
 Julia [name] here for a UPT. Pt had a positive upt at home. LMP is 05/28/24.     UPT in office Positive.    Reviewed medications and informed to start a PNV, if not already. Pt already taking a PNV. Not currently experiencing any nausea. Safe medications list provided. Pt BP elevated during today's visit (163/121 [94]; 159/107 [88]). Pt reports having borderline HTN during previous pregnancy but did not require medication. Consulted with Dr. Alger. Initiated labetalol  200 mg BID. BP cuff sent to [facility]. Pt would like refill of nicotine pills and Zoloft ; message sent Dr. Rudy per patient request. Pt to follow up in 2 weeks for New OB visit.

## 2024-07-14 ENCOUNTER — Ambulatory Visit: Payer: Self-pay | Admitting: Obstetrics and Gynecology

## 2024-07-28 ENCOUNTER — Other Ambulatory Visit (INDEPENDENT_AMBULATORY_CARE_PROVIDER_SITE_OTHER): Payer: Self-pay

## 2024-07-28 ENCOUNTER — Ambulatory Visit (INDEPENDENT_AMBULATORY_CARE_PROVIDER_SITE_OTHER): Admitting: *Deleted

## 2024-07-28 VITALS — BP 124/75 | HR 75 | Wt 146.9 lb

## 2024-07-28 DIAGNOSIS — O0991 Supervision of high risk pregnancy, unspecified, first trimester: Secondary | ICD-10-CM

## 2024-07-28 DIAGNOSIS — Z3A08 8 weeks gestation of pregnancy: Secondary | ICD-10-CM | POA: Diagnosis not present

## 2024-07-28 DIAGNOSIS — O0990 Supervision of high risk pregnancy, unspecified, unspecified trimester: Secondary | ICD-10-CM

## 2024-07-28 DIAGNOSIS — O3680X0 Pregnancy with inconclusive fetal viability, not applicable or unspecified: Secondary | ICD-10-CM

## 2024-07-28 DIAGNOSIS — O09529 Supervision of elderly multigravida, unspecified trimester: Secondary | ICD-10-CM | POA: Diagnosis not present

## 2024-07-28 MED ORDER — VITAFOL GUMMIES 3.33-0.333-34.8 MG PO CHEW: 1.0000 | CHEWABLE_TABLET | Freq: Every day | ORAL | 5 refills | Status: AC

## 2024-07-28 NOTE — Patient Instructions (Signed)

## 2024-07-28 NOTE — Progress Notes (Signed)
 New OB Intake  I connected with Julia [name]  on 07/28/24 at 10:15 AM EDT by In Person Visit and verified that I am speaking with the correct person using two identifiers. Nurse is located at [facility] and pt is located at [place].  I discussed the limitations, risks, security and privacy concerns of performing an evaluation and management service by telephone and the availability of in person appointments. I also discussed with the patient that there may be a patient responsible charge related to this service. The patient expressed understanding and agreed to proceed.  I explained I am completing New OB Intake today. We discussed EDD of 03/04/25 based on LMP of 05/28/24. Pt is [id number]. I reviewed her allergies, medications and Medical/Surgical/OB history.    Patient Active Problem List   Diagnosis Date Noted   History of trichomoniasis 09/06/2016   Previous cesarean delivery x 2 05/29/2016   Pap smear abnormality of cervix with LGSIL 04/09/2016     Concerns addressed today  Delivery Plans Plans to deliver at [facility]. Discussed the nature of our practice with multiple providers including residents and students as well as female and female providers. Due to the size of the practice, the delivering provider may not be the same as those providing prenatal care.   MyChart/Babyscripts MyChart access verified. I explained pt will have some visits in office and some virtually. Babyscripts instructions given and order placed. Patient verifies receipt of registration text/e-mail. Account successfully created and app downloaded. If patient is a candidate for Optimized scheduling, add to sticky note.   Blood Pressure Cuff/Weight Scale Has BP cuff already.  Explained after first prenatal appt pt will check weekly and document in Babyscripts. Patient does not have weight scale; patient may purchase if they desire to track weight weekly in Babyscripts.  Anatomy US  Explained first scheduled US  will be around  19 weeks. Anatomy US  scheduled for TBD at TBD.  Is patient a candidate for Babyscripts Optimization? No, due to Risk Factors   First visit review I reviewed new OB appt with patient. Explained pt will be seen by Dr. Erik at first visit. Discussed Julia [name] genetic screening with patient. Considering Pan[facility] and [facility].. Routine prenatal labs OB Urine collected at today's visit. Initial prenatal labs deferred to New OB visit.   Last Pap Diagnosis  Date Value Ref Range Status  04/17/2021   Final   - Negative for intraepithelial lesion or malignancy (NILM)    Rocky Julia Ober, RN 07/28/2024  10:30 AM

## 2024-07-31 ENCOUNTER — Ambulatory Visit: Payer: Self-pay | Admitting: Obstetrics and Gynecology

## 2024-07-31 LAB — CULTURE, OB URINE

## 2024-07-31 LAB — URINE CULTURE, OB REFLEX: Organism ID, Bacteria: NO GROWTH

## 2024-07-31 NOTE — Telephone Encounter (Signed)
 Attempted to contact about smoking cessation, no answer, left vm

## 2024-08-03 ENCOUNTER — Ambulatory Visit (INDEPENDENT_AMBULATORY_CARE_PROVIDER_SITE_OTHER): Payer: Self-pay | Admitting: Licensed Clinical Social Worker

## 2024-08-03 DIAGNOSIS — F4329 Adjustment disorder with other symptoms: Secondary | ICD-10-CM | POA: Diagnosis not present

## 2024-08-03 NOTE — BH Specialist Note (Unsigned)
 Integrated Behavioral Health via Telemedicine Visit  08/04/2024 Alejandria Wessells [id number]  Number of Integrated Behavioral Health Clinician visits: 1- Initial Visit  Session Start time: 1015   Session End time: 1032  Total time in minutes: 17    Referring Provider: Zina, MD Patient/Family location: At home [facility] Provider location: Remote Office All persons participating in visit: Patient and [facility] Types of Service: Individual psychotherapy and Video visit  I connected with Luan Barrows and/or Betti Lieu patient via  Telephone or Engineer, civil (consulting)  (Video is Caregility application) and verified that I am speaking with the correct person using two identifiers. Discussed confidentiality: Yes   I discussed the limitations of telemedicine and the availability of in person appointments.  Discussed there is a possibility of technology failure and discussed alternative modes of communication if that failure occurs.  I discussed that engaging in this telemedicine visit, they consent to the provision of behavioral healthcare and the services will be billed under their insurance.  Patient and/or legal guardian expressed understanding and consented to Telemedicine visit: Yes   Presenting Concerns: Patient and/or family reports the following symptoms/concerns: Patient shared feelings of being overwhelmed.  Duration of problem: Months; Severity of problem: mild  Patient and/or Family's Strengths/Protective Factors: Social and Emotional competence, Concrete supports in place (healthy food, safe environments, etc.), Physical Health (exercise, healthy diet, medication compliance, etc.), and Caregiver has knowledge of parenting & child development  Goals Addressed: Patient will:  Reduce symptoms of: depression and mood instability   Increase knowledge and/or ability of: coping skills, healthy habits, and self-management skills   Demonstrate ability to: Increase healthy  adjustment to current life circumstances and Increase adequate support systems for patient/family  Progress towards Goals: Discontinued    Interventions: Interventions utilized:  Psychoeducation and/or Health Education and Supportive Reflection Standardized Assessments completed: Not Needed    Patient and/or Family Response: Patient was present for today's virtual session and reported that she is currently pregnant. She requested a refill of sertraline  50mg  and stated she has no specific complaints, expressing that she loves life but is feeling overwhelmed. Patient openly shared that she does not feel therapy is necessary at this time and prefers to manage her mental health through medication alone.   Clinical Assessment/Diagnosis  Adjustment disorder with other symptom    Assessment: Patient currently experiencing feelings of being overwhelmed during her pregnancy but reports an overall positive outlook on life. She does not identify any specific mental health complaints and prefers medication management over therapy..   Patient may benefit from continued support of integrated behavioral health services when needed.  Plan: Follow up with behavioral health clinician on : No follow up scheduled.  Behavioral recommendations: patient to continue medication management under the supervision of her prescribing provider and monitor for any changes in mood or functioning during pregnancy. While she is not currently interested in therapy, supportive counseling remains an option should her needs or preferences change. Referral(s): Integrated [employer] (In Clinic)  I discussed the assessment and treatment plan with the patient and/or parent/guardian. They were provided an opportunity to ask questions and all were answered. They agreed with the plan and demonstrated an understanding of the instructions.   They were advised to call back or seek an in-person evaluation if the  symptoms worsen or if the condition fails to improve as anticipated.  Leshaun Biebel LITTIE Seats, LCSWA

## 2024-08-04 ENCOUNTER — Other Ambulatory Visit: Payer: Self-pay | Admitting: Medical Genetics

## 2024-08-18 ENCOUNTER — Telehealth: Payer: Self-pay

## 2024-08-18 NOTE — Telephone Encounter (Signed)
 Left voicemail for her to call the [facility] office back to schedule an IBH appointment.

## 2024-08-25 ENCOUNTER — Ambulatory Visit (INDEPENDENT_AMBULATORY_CARE_PROVIDER_SITE_OTHER): Admitting: Obstetrics and Gynecology

## 2024-08-25 ENCOUNTER — Other Ambulatory Visit (HOSPITAL_COMMUNITY)
Admission: RE | Admit: 2024-08-25 | Discharge: 2024-08-25 | Disposition: A | Discharge status: Home / Self Care | Source: Ambulatory Visit | Attending: Obstetrics and Gynecology | Admitting: Obstetrics and Gynecology

## 2024-08-25 ENCOUNTER — Encounter: Payer: Self-pay | Admitting: Obstetrics and Gynecology

## 2024-08-25 VITALS — BP 131/86 | HR 88 | Wt 163.0 lb

## 2024-08-25 DIAGNOSIS — O34219 Maternal care for unspecified type scar from previous cesarean delivery: Secondary | ICD-10-CM | POA: Diagnosis not present

## 2024-08-25 DIAGNOSIS — Z3A12 12 weeks gestation of pregnancy: Secondary | ICD-10-CM | POA: Insufficient documentation

## 2024-08-25 DIAGNOSIS — F3341 Major depressive disorder, recurrent, in partial remission: Secondary | ICD-10-CM

## 2024-08-25 DIAGNOSIS — O0990 Supervision of high risk pregnancy, unspecified, unspecified trimester: Secondary | ICD-10-CM | POA: Diagnosis present

## 2024-08-25 DIAGNOSIS — I10 Essential (primary) hypertension: Secondary | ICD-10-CM

## 2024-08-25 DIAGNOSIS — O09529 Supervision of elderly multigravida, unspecified trimester: Secondary | ICD-10-CM

## 2024-08-25 DIAGNOSIS — Z720 Tobacco use: Secondary | ICD-10-CM

## 2024-08-25 MED ORDER — SERTRALINE HCL 50 MG PO TABS
50.0000 mg | ORAL_TABLET | Freq: Every day | ORAL | 3 refills | Status: AC
Start: 2024-08-25 — End: ?

## 2024-08-25 MED ORDER — BUPROPION HCL ER (SR) 150 MG PO TB12
150.0000 mg | ORAL_TABLET | Freq: Two times a day (BID) | ORAL | 1 refills | Status: AC
Start: 2024-08-25 — End: ?

## 2024-08-25 MED ORDER — ASPIRIN 81 MG PO TBEC
81.0000 mg | DELAYED_RELEASE_TABLET | Freq: Every day | ORAL | 2 refills | Status: AC
Start: 2024-08-25 — End: ?
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
## 2024-08-25 NOTE — Patient Instructions (Signed)
 For the Wellbutrin, set the quit date about 1-2 weeks into therapy.

## 2024-08-26 ENCOUNTER — Ambulatory Visit: Payer: Self-pay | Admitting: Obstetrics and Gynecology

## 2024-08-26 ENCOUNTER — Other Ambulatory Visit

## 2024-08-26 DIAGNOSIS — D563 Thalassemia minor: Secondary | ICD-10-CM

## 2024-08-26 DIAGNOSIS — O09529 Supervision of elderly multigravida, unspecified trimester: Secondary | ICD-10-CM

## 2024-08-26 DIAGNOSIS — O0990 Supervision of high risk pregnancy, unspecified, unspecified trimester: Secondary | ICD-10-CM

## 2024-08-26 LAB — CBC/D/PLT+RPR+RH+ABO+RUBIGG...
Antibody Screen: NEGATIVE
Basophils Absolute: 0 x10E3/uL (ref 0.0–0.2)
Basos: 1 %
EOS (ABSOLUTE): 0.3 x10E3/uL (ref 0.0–0.4)
Eos: 4 %
HCV Ab: NONREACTIVE
HIV Screen 4th Generation wRfx: NONREACTIVE
Hematocrit: 34.6 % (ref 34.0–46.6)
Hemoglobin: 11.1 g/dL (ref 11.1–15.9)
Hepatitis B Surface Ag: NEGATIVE
Immature Grans (Abs): 0 x10E3/uL (ref 0.0–0.1)
Immature Granulocytes: 0 %
Lymphocytes Absolute: 2.2 x10E3/uL (ref 0.7–3.1)
Lymphs: 31 %
MCH: 29.4 pg (ref 26.6–33.0)
MCHC: 32.1 g/dL (ref 31.5–35.7)
MCV: 92 fL (ref 79–97)
Monocytes Absolute: 0.5 x10E3/uL (ref 0.1–0.9)
Monocytes: 7 %
Neutrophils Absolute: 3.9 x10E3/uL (ref 1.4–7.0)
Neutrophils: 57 %
Platelets: 330 x10E3/uL (ref 150–450)
RBC: 3.78 x10E6/uL (ref 3.77–5.28)
RDW: 12.5 % (ref 11.7–15.4)
RPR Ser Ql: NONREACTIVE
Rh Factor: POSITIVE
Rubella Antibodies, IGG: 4.12 {index} (ref >0.99)
WBC: 6.9 x10E3/uL (ref 3.4–10.8)

## 2024-08-26 LAB — COMPREHENSIVE METABOLIC PANEL WITH GFR
ALT: 11 IU/L (ref 0–32)
AST: 17 IU/L (ref 0–40)
Albumin: 4.1 g/dL (ref 3.9–4.9)
Alkaline Phosphatase: 67 IU/L (ref 41–116)
BUN/Creatinine Ratio: 11 (ref 9–23)
BUN: 7 mg/dL (ref 6–24)
Bilirubin Total: 0.5 mg/dL (ref 0.0–1.2)
CO2: 19 mmol/L — ABNORMAL LOW (ref 20–29)
Calcium: 8.8 mg/dL (ref 8.7–10.2)
Chloride: 102 mmol/L (ref 96–106)
Creatinine, Ser: 0.62 mg/dL (ref 0.57–1.00)
Globulin, Total: 1.8 g/dL (ref 1.5–4.5)
Glucose: 93 mg/dL (ref 70–99)
Potassium: 3.9 mmol/L (ref 3.5–5.2)
Sodium: 137 mmol/L (ref 134–144)
Total Protein: 5.9 g/dL — ABNORMAL LOW (ref 6.0–8.5)
eGFR: 114 mL/min/1.73 (ref >59)

## 2024-08-26 LAB — HEMOGLOBIN A1C
Est. average glucose Bld gHb Est-mCnc: 94 mg/dL
Hgb A1c MFr Bld: 4.9 % (ref 4.8–5.6)

## 2024-08-26 LAB — PROTEIN / CREATININE RATIO, URINE
Creatinine, Urine: 181.3 mg/dL
Protein, Ur: 21.5 mg/dL
Protein/Creat Ratio: 119 mg/g{creat} (ref 0–200)

## 2024-08-26 LAB — HCV INTERPRETATION

## 2024-08-27 LAB — CYTOLOGY - PAP
Comment: NEGATIVE
Diagnosis: NEGATIVE
High risk HPV: NEGATIVE

## 2024-09-04 DIAGNOSIS — D563 Thalassemia minor: Secondary | ICD-10-CM | POA: Insufficient documentation

## 2024-09-04 LAB — HORIZON CUSTOM: REPORT SUMMARY: POSITIVE — AB

## 2024-09-04 LAB — CERVICOVAGINAL ANCILLARY ONLY
Bacterial Vaginitis (gardnerella): NEGATIVE
Candida Glabrata: NEGATIVE
Candida Vaginitis: NEGATIVE
Chlamydia: NEGATIVE
Comment: NEGATIVE
Comment: NEGATIVE
Comment: NEGATIVE
Comment: NEGATIVE
Comment: NEGATIVE
Comment: NORMAL
Neisseria Gonorrhea: NEGATIVE
Trichomonas: NEGATIVE

## 2024-09-09 ENCOUNTER — Other Ambulatory Visit: Payer: Self-pay

## 2024-09-09 DIAGNOSIS — D563 Thalassemia minor: Secondary | ICD-10-CM

## 2024-09-22 ENCOUNTER — Encounter: Admitting: Obstetrics and Gynecology

## 2024-09-23 ENCOUNTER — Encounter: Payer: Self-pay | Admitting: Obstetrics and Gynecology

## 2024-09-23 ENCOUNTER — Ambulatory Visit: Admitting: Obstetrics and Gynecology

## 2024-09-23 VITALS — BP 119/71 | HR 71 | Wt 165.3 lb

## 2024-09-23 DIAGNOSIS — O0990 Supervision of high risk pregnancy, unspecified, unspecified trimester: Secondary | ICD-10-CM

## 2024-09-23 DIAGNOSIS — O34219 Maternal care for unspecified type scar from previous cesarean delivery: Secondary | ICD-10-CM

## 2024-09-23 DIAGNOSIS — D563 Thalassemia minor: Secondary | ICD-10-CM

## 2024-09-23 DIAGNOSIS — O09529 Supervision of elderly multigravida, unspecified trimester: Secondary | ICD-10-CM

## 2024-09-23 DIAGNOSIS — I10 Essential (primary) hypertension: Secondary | ICD-10-CM

## 2024-09-23 DIAGNOSIS — Z720 Tobacco use: Secondary | ICD-10-CM

## 2024-09-23 NOTE — Progress Notes (Unsigned)
   PRENATAL VISIT NOTE  Subjective:  Julia [name] is a 43 y.o. H1E6956 at [redacted]w[redacted]d being seen today for ongoing prenatal care.  She is currently monitored for the following issues for this high-risk pregnancy and has Previous cesarean delivery x 3; Supervision of high risk pregnancy, antepartum; AMA (advanced maternal age) multigravida 35+; Tobacco abuse; Chronic hypertension; and Alpha thalassemia silent carrier on their problem list.  Patient reports wellbutrin  not helping with tobacco cessation.  Contractions: Not present. Vag. Bleeding: None.  Movement: Absent. Denies leaking of fluid.   The following portions of the patient's history were reviewed and updated as appropriate: allergies, current medications, past family history, past medical history, past social history, past surgical history and problem list.   Objective:   Vitals:   09/23/24 0939  BP: 119/71  Pulse: 71  Weight: 165 lb 4.8 oz (75 kg)    Fetal Status:  Fetal Heart Rate (bpm): 153   Movement: Absent    General: Alert, oriented and cooperative. Patient is in no acute distress.  Skin: Skin is warm and dry. No rash noted.   Cardiovascular: Normal heart rate noted  Respiratory: Normal respiratory effort, no problems with respiration noted  Abdomen: Soft, gravid, appropriate for gestational age.  Pain/Pressure: Present     Pelvic: Cervical exam deferred        Extremities: Normal range of motion.  Edema: None  Mental Status: Normal mood and affect. Normal behavior. Normal judgment and thought content.   Assessment and Plan:  Pregnancy: H1E6956 at [redacted]w[redacted]d 1. Supervision of high risk pregnancy, antepartum (Primary) BP and FHR normal Discussed accommodations for work, provided letter today stating she is pregnancy. Unsure about accommodations yet, she is going to look in to this and get back to me  2. Previous cesarean delivery x 3 Plan RCS  3. Chronic hypertension Labetalol  200mg  BID and ASA  Normotensive   4.  Antepartum multigravida of advanced maternal age LR nips  Anatomy scan 12/29  5. Alpha thalassemia silent carrier Partner kit sent today   6. Tobacco abuse  Has been trying the wellbutrin  once a day, discussed increasing to twice a day    Preterm labor symptoms and general obstetric precautions including but not limited to vaginal bleeding, contractions, leaking of fluid and fetal movement were reviewed in detail with the patient. Please refer to After Visit Summary for other counseling recommendations.   Return in about 4 weeks (around 10/21/2024) for OB VISIT (MD or APP).  Future Appointments  Date Time Provider Department Center  10/19/2024 10:00 AM [facility] PROVIDER 1 [facility]  10/19/2024 10:30 AM [facility] US3 [facility]  10/19/2024  1:00 PM [facility] GENETIC COUNSELING RM [facility]    Nidia Daring, FNP

## 2024-09-23 NOTE — Progress Notes (Unsigned)
 Pt presents for ROB visit. Pt states Wellbutrin  is not working for smoking cessation.

## 2024-09-23 NOTE — Patient Instructions (Signed)
 MFM : [phone] [address], [place] [address]

## 2024-09-24 LAB — PANORAMA PRENATAL TEST FULL PANEL:PANORAMA TEST PLUS 5 ADDITIONAL MICRODELETIONS: FETAL FRACTION: 10.5

## 2024-09-25 LAB — AFP, SERUM, OPEN SPINA BIFIDA
AFP MoM: 1.14
AFP Value: 38.8 ng/mL
Gest. Age on Collection Date: 16 wk
Maternal Age At EDD: 42.8 a
OSBR Risk 1 IN: 10000
Test Results:: NEGATIVE
Weight: 165 [lb_av]

## 2024-09-28 ENCOUNTER — Ambulatory Visit: Payer: Self-pay | Admitting: Obstetrics and Gynecology

## 2024-10-05 DIAGNOSIS — O09299 Supervision of pregnancy with other poor reproductive or obstetric history, unspecified trimester: Secondary | ICD-10-CM | POA: Insufficient documentation

## 2024-10-19 ENCOUNTER — Other Ambulatory Visit: Payer: Self-pay | Admitting: *Deleted

## 2024-10-19 ENCOUNTER — Ambulatory Visit

## 2024-10-19 ENCOUNTER — Ambulatory Visit: Attending: Obstetrics | Admitting: Obstetrics

## 2024-10-19 VITALS — BP 123/64 | HR 81

## 2024-10-19 DIAGNOSIS — Z79899 Other long term (current) drug therapy: Secondary | ICD-10-CM | POA: Insufficient documentation

## 2024-10-19 DIAGNOSIS — O10919 Unspecified pre-existing hypertension complicating pregnancy, unspecified trimester: Secondary | ICD-10-CM

## 2024-10-19 DIAGNOSIS — O34219 Maternal care for unspecified type scar from previous cesarean delivery: Secondary | ICD-10-CM | POA: Insufficient documentation

## 2024-10-19 DIAGNOSIS — O09529 Supervision of elderly multigravida, unspecified trimester: Secondary | ICD-10-CM

## 2024-10-19 DIAGNOSIS — O0990 Supervision of high risk pregnancy, unspecified, unspecified trimester: Secondary | ICD-10-CM | POA: Diagnosis present

## 2024-10-19 DIAGNOSIS — O99012 Anemia complicating pregnancy, second trimester: Secondary | ICD-10-CM

## 2024-10-19 DIAGNOSIS — O99332 Smoking (tobacco) complicating pregnancy, second trimester: Secondary | ICD-10-CM | POA: Diagnosis not present

## 2024-10-19 DIAGNOSIS — O10013 Pre-existing essential hypertension complicating pregnancy, third trimester: Secondary | ICD-10-CM | POA: Diagnosis not present

## 2024-10-19 DIAGNOSIS — D563 Thalassemia minor: Secondary | ICD-10-CM

## 2024-10-19 DIAGNOSIS — Z3A20 20 weeks gestation of pregnancy: Secondary | ICD-10-CM | POA: Insufficient documentation

## 2024-10-19 DIAGNOSIS — O09522 Supervision of elderly multigravida, second trimester: Secondary | ICD-10-CM | POA: Insufficient documentation

## 2024-10-19 DIAGNOSIS — Z720 Tobacco use: Secondary | ICD-10-CM

## 2024-10-19 DIAGNOSIS — Z363 Encounter for antenatal screening for malformations: Secondary | ICD-10-CM | POA: Diagnosis not present

## 2024-10-19 DIAGNOSIS — O10912 Unspecified pre-existing hypertension complicating pregnancy, second trimester: Secondary | ICD-10-CM

## 2024-10-19 DIAGNOSIS — Z362 Encounter for other antenatal screening follow-up: Secondary | ICD-10-CM

## 2024-10-19 DIAGNOSIS — O10012 Pre-existing essential hypertension complicating pregnancy, second trimester: Secondary | ICD-10-CM | POA: Diagnosis not present

## 2024-10-19 DIAGNOSIS — O09299 Supervision of pregnancy with other poor reproductive or obstetric history, unspecified trimester: Secondary | ICD-10-CM

## 2024-10-19 DIAGNOSIS — O09292 Supervision of pregnancy with other poor reproductive or obstetric history, second trimester: Secondary | ICD-10-CM

## 2024-10-19 DIAGNOSIS — O280 Abnormal hematological finding on antenatal screening of mother: Secondary | ICD-10-CM | POA: Diagnosis not present

## 2024-10-19 DIAGNOSIS — I10 Essential (primary) hypertension: Secondary | ICD-10-CM

## 2024-10-19 DIAGNOSIS — Z148 Genetic carrier of other disease: Secondary | ICD-10-CM | POA: Diagnosis not present

## 2024-10-19 NOTE — Progress Notes (Signed)
 "    [facility] at [facility] [address] Phone:  [phone]   Fax:  [phone]      In-Person Genetic Counseling Clinic Note:   I spoke with 43 y.o. Julia [name] today to discuss her carrier screening results. She was referred by Constant, Peggy, MD. She was accompanied by FOB Deonte.   Pregnancy History:    H1E6956. EGA: [redacted]w[redacted]d by LMP. EDD: 03/04/2025. Julia [name] has three healthy children. She had 4 IABs for personal reasons. Denies major personal health concerns. Denies bleeding, infections, and fevers in this pregnancy. Reports smoking 5-6 cigarettes/day. Patient is trying to reduce use and is aware of negative effects of smoking. Denies using alcohol or street drugs in this pregnancy.   Family History:    A three-generation pedigree was created and scanned into Epic under the Media tab.  Patient ethnicity reported as Black and FOB ethnicity reported as Black. Denies [name] ancestry.  Family history not remarkable for consanguinity, individuals with birth defects, intellectual disability, autism spectrum disorder, multiple spontaneous abortions, still births, or unexplained neonatal death.   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 She screened negative for the other three conditions (CF, SMA, and beta-hemoglobinopathies) which significantly reduces but does not eliminate the chance of being a carrier for those conditions. Please see report for residual risk information.  We reviewed the genetics of alpha thalassemia, autosomal recessive mode of inheritance, and clinical features of these conditions. We reviewed that Julia [name] will either pass down two copies of the alpha globin gene (??) OR one copy of the alpha globin gene (-?) in each pregnancy. Therefore, this pregnancy is not at increased  risk for hemoglobin Bart's due to four deletions of the alpha globin genes (--/--) regardless of her reproductive partner's carrier status. The pregnancy will be at increased risk (25%) for hemoglobin H disease if her partner is an alpha thalassemia carrier in the cis configuration (--/??). We reviewed that this is more common in Southeast Asian populations and less common in those with Black ancestry.  Given these results, we discussed and offered carrier screening for Julia [name]'s reproductive partner. We reviewed the benefits and limitations of carrier screening and that it can detect most but not all carriers.   The couple consented carrier screening for FOB. His blood was drawn during today's visit. He consented that we call Julia [name] with the results. We reviewed that if both were found to be carriers, prenatal diagnosis through amniocentesis would be available. We reviewed the technical aspects, benefits, risks, and limitations of amniocentesis including the 1 in 500 risk for miscarriage.  Alternatively, testing can be completed postnatally. Of note, alpha thalassemia is not included on Julia [name] 's Newborn Screening (NBS) program.   Newborn Screening. The [facility]  Newborn Screening (NBS) program will screen all newborn babies for cystic fibrosis, spinal muscular atrophy, hemoglobinopathies, and numerous other conditions.  Advanced Maternal Age:  We briefly discussed that the chance that a fetus would be affected with a chromosome difference increases with advanced maternal age. Julia [name]'s current age-related risk to have a pregnancy affected with a chromosome difference is approximately 1 in 39 (~2.6%). We discussed that this risk may also be lower given her low-risk NIPS results and normal anatomy ultrasound.  Julia [name] declined amniocentesis.  Previous Testing Completed:  Low risk NIPS: Julia [name] previously  completed [facility] noninvasive prenatal screening (NIPS) in this pregnancy. The result is  low risk, consistent with a female fetus. This screening significantly reduces but does not eliminate the chance that the current pregnancy has Down syndrome (trisomy 88), trisomy 43, trisomy 59, common sex chromosome conditions, and 22q11.2 microdeletion syndrome. Please see report for residual risk information. There are many genetic conditions that cannot be detected by NIPS.   Negative ms-AFP screening: Julia [name] previously completed a maternal serum AFP screen in this pregnancy. The result is screen negative. Please see report for residual risk information. A negative result reduces the risk that the current pregnancy has an open neural tube defect. Closed neural tube defects and some open defects may not be detected by this screen.   Plan of Care:   FOB carrier screening for alpha thalassemia was collected today. We will communicate the results to [facility]. Declined amniocentesis. Follow-up [facility] ultrasound on 11/23/2024.   Informed consent was obtained. All questions were answered.   40 minutes were spent on the date of the encounter in service to the patient including preparation, face-to-face consultation, discussion of test reports and available next steps, pedigree construction, genetic risk assessment, documentation, and care coordination.    Thank you for sharing in the care of Julia [name] with us .  Please do not hesitate to contact us  at [phone] if you have any questions.   Lauraine Bodily, MS, [facility] Certified Genetic Counselor   Genetic counseling student involved in appointment: No. "

## 2024-10-19 NOTE — Progress Notes (Unsigned)
 MFM Consult Note  Julia [name] is currently at [redacted]w[redacted]d. She was seen today for a detailed fetal anatomy scan due to advanced maternal age (43 years old) and chronic hypertension treated with labetalol .  Her [facility] screening test indicated that she is a silent carrier for alpha thalassemia.  She denies any problems in her current pregnancy.  Her blood pressure today was 123/64.  She had a cell free DNA test earlier in her pregnancy which indicated a low risk for trisomy 32, 35, and 13. A female fetus is predicted.   Sonographic findings Single intrauterine pregnancy at 20w 4d. Fetal cardiac activity:  Observed and appears normal. Presentation: Breech. The anatomic structures that were well seen appear normal. The views of the fetal anatomy were limited today due to the fetal position. Fetal biometry shows the estimated fetal weight of 0 lb 13 oz,  364 grams (46%). Amniotic fluid: Within normal limits.  MVP: 5.78 cm. Placenta: Posterior. Adnexa: No abnormality visualized. Cervical length: 3.4 cm.  The patient was informed that anomalies may be missed due to technical limitations. If the fetus is in a suboptimal position or maternal habitus is increased, visualization of the fetus in the maternal uterus may be impaired.  Advanced maternal age  The increased risk of fetal aneuploidy due to advanced maternal age was discussed.   Due to advanced maternal age, the patient was offered and declined an amniocentesis today for definitive diagnosis of fetal aneuploidy. She is comfortable with the low risk indicated by her cell free DNA test.  Chronic Hypertension in Pregnancy  The implications and management of chronic hypertension in pregnancy was discussed.   She was advised to continue taking labetalol  as prescribed for the duration of her pregnancy.  The increased risk of superimposed preeclampsia, an indicated preterm delivery, and possible fetal growth restriction due to chronic  hypertension in pregnancy was discussed.   We will continue to follow her with growth ultrasounds throughout her pregnancy.   To decrease her risk of superimposed preeclampsia, she should continue taking a daily baby aspirin  (81 mg daily) for preeclampsia prophylaxis.   Silent carrier for alpha thalassemia  The patient and her partner met with our genetic counselor following today's ultrasound exam.  He had his blood drawn today today to determine if he is also a carrier for alpha thalassemia.  They were reassured that should he be determined not to be a carrier for alpha thalassemia, their child should not be at risk for inheriting this condition.    Should both parents be carriers for this condition, an amniocentesis is available for definitive prenatal diagnosis.  A follow-up growth scan was scheduled in 5 weeks.  The patient stated that all of her questions were answered.   A total of 45 minutes was spent counseling and coordinating the care for this patient.  Greater than 50% of the time was spent in direct face-to-face contact.

## 2024-10-26 ENCOUNTER — Encounter: Payer: Self-pay | Admitting: Obstetrics and Gynecology

## 2024-10-26 ENCOUNTER — Encounter: Admitting: Obstetrics and Gynecology

## 2024-10-26 ENCOUNTER — Ambulatory Visit: Payer: Self-pay

## 2024-10-26 VITALS — BP 120/78 | HR 79 | Wt 173.0 lb

## 2024-10-26 DIAGNOSIS — O09299 Supervision of pregnancy with other poor reproductive or obstetric history, unspecified trimester: Secondary | ICD-10-CM

## 2024-10-26 DIAGNOSIS — O34219 Maternal care for unspecified type scar from previous cesarean delivery: Secondary | ICD-10-CM | POA: Diagnosis not present

## 2024-10-26 DIAGNOSIS — I10 Essential (primary) hypertension: Secondary | ICD-10-CM | POA: Diagnosis not present

## 2024-10-26 DIAGNOSIS — O0992 Supervision of high risk pregnancy, unspecified, second trimester: Secondary | ICD-10-CM

## 2024-10-26 DIAGNOSIS — O09292 Supervision of pregnancy with other poor reproductive or obstetric history, second trimester: Secondary | ICD-10-CM

## 2024-10-26 DIAGNOSIS — O09522 Supervision of elderly multigravida, second trimester: Secondary | ICD-10-CM

## 2024-10-26 DIAGNOSIS — O0990 Supervision of high risk pregnancy, unspecified, unspecified trimester: Secondary | ICD-10-CM

## 2024-10-26 NOTE — Progress Notes (Signed)
 "  PRENATAL VISIT NOTE  Subjective:  Julia [name] is a 44 y.o. H1E6956 at [redacted]w[redacted]d being seen today for ongoing prenatal care.  She is currently monitored for the following issues for this high-risk pregnancy and has Previous cesarean delivery x 3; Supervision of high risk pregnancy, antepartum; AMA (advanced maternal age) multigravida 35+; Tobacco abuse; Chronic hypertension; Alpha thalassemia silent carrier; and History of pre-eclampsia in prior pregnancy, currently pregnant on their problem list.  Patient reports no complaints.  Contractions: Not present. Vag. Bleeding: None.  Movement: Present. Denies leaking of fluid.   The following portions of the patient's history were reviewed and updated as appropriate: allergies, current medications, past family history, past medical history, past social history, past surgical history and problem list.   Objective:   Vitals:   10/26/24 1027  BP: 120/78  Pulse: 79  Weight: 173 lb (78.5 kg)    Fetal Status:  Fetal Heart Rate (bpm): 152   Movement: Present    General: Alert, oriented and cooperative. Patient is in no acute distress.  Skin: Skin is warm and dry. No rash noted.   Cardiovascular: Normal heart rate noted  Respiratory: Normal respiratory effort, no problems with respiration noted  Abdomen: Soft, gravid, appropriate for gestational age.  Pain/Pressure: Absent     Pelvic: Cervical exam deferred        Extremities: Normal range of motion.  Edema: None  Mental Status: Normal mood and affect. Normal behavior. Normal judgment and thought content.      07/28/2024   11:00 AM  Depression screen PHQ 2/9  Decreased Interest 3  Down, Depressed, Hopeless 3  PHQ - 2 Score 6  Altered sleeping 0  Tired, decreased energy 3  Change in appetite 0  Feeling bad or failure about yourself  0  Trouble concentrating 3  Moving slowly or fidgety/restless 0  Suicidal thoughts 0  PHQ-9 Score 12      Data saved with a previous flowsheet row definition         07/28/2024   11:07 AM 03/15/2020    9:56 AM  GAD 7 : Generalized Anxiety Score  Nervous, Anxious, on Edge 3 3  Control/stop worrying 3 3  Worry too much - different things 3 3  Trouble relaxing 3 3  Restless 0 0  Easily annoyed or irritable 3 3  Afraid - awful might happen 0 0  Total GAD 7 Score 15 15  Anxiety Difficulty  Extremely difficult    Assessment and Plan:  Pregnancy: [id number] at [redacted]w[redacted]d 1. Supervision of high risk pregnancy, antepartum (Primary) Patient is doing well without complaints Anatomy ultrasound reviewed with the patient Patient has accomodation form for work to be completed. Her job has already accommodated her position and patient is pleased with that. She simply never submitted the required paperwork. Patient desires to work as long as possible and will plan to reduce her hours at a later date. Patient advised to bring forms to be completed when ready  2. Chronic hypertension 3. History of pre-eclampsia in prior pregnancy, currently pregnant Normotensive Continue labetalol  and ASA Follow up growth ultrasound 11/23/24  4. Previous cesarean delivery x 3 Will plan for a repeat  5. Multigravida of advanced maternal age in second trimester   Preterm labor symptoms and general obstetric precautions including but not limited to vaginal bleeding, contractions, leaking of fluid and fetal movement were reviewed in detail with the patient. Please refer to After Visit Summary for other counseling recommendations.   Return  in about 4 weeks (around 11/23/2024) for in person, ROB, High risk.  Future Appointments  Date Time Provider Department Center  11/19/2024 10:15 AM Nicholaus Burnard HERO, MD [facility] None  11/23/2024  9:15 AM [facility] PROVIDER 1 [facility]  11/23/2024  9:30 AM [facility] US3 [facility]    Winton Felt, MD  "

## 2024-11-03 ENCOUNTER — Telehealth: Payer: Self-pay

## 2024-11-03 NOTE — Telephone Encounter (Signed)
 Attempted to call patient with FOB's carrier screening results. Left message with callback number.

## 2024-11-09 ENCOUNTER — Encounter: Payer: Self-pay | Admitting: Obstetrics and Gynecology

## 2024-11-10 ENCOUNTER — Other Ambulatory Visit: Payer: Self-pay | Admitting: Medical Genetics

## 2024-11-10 DIAGNOSIS — Z006 Encounter for examination for normal comparison and control in clinical research program: Secondary | ICD-10-CM

## 2024-11-16 ENCOUNTER — Encounter: Admitting: Obstetrics and Gynecology

## 2024-11-19 ENCOUNTER — Encounter: Payer: Self-pay | Admitting: Obstetrics and Gynecology

## 2024-11-23 ENCOUNTER — Ambulatory Visit

## 2024-11-24 ENCOUNTER — Encounter: Payer: Self-pay | Admitting: Obstetrics

## 2024-12-01 ENCOUNTER — Encounter: Admitting: Obstetrics
